# Patient Record
Sex: Female | Born: 1990 | Race: White | Hispanic: No | Marital: Single | Smoking: Never smoker
Health system: Southern US, Community
[De-identification: ages and names within clinical notes are randomized; demographics above are authoritative.]

## PROBLEM LIST (undated history)

## (undated) DIAGNOSIS — F32A Depression, unspecified: Secondary | ICD-10-CM

## (undated) DIAGNOSIS — F329 Major depressive disorder, single episode, unspecified: Secondary | ICD-10-CM

## (undated) DIAGNOSIS — G588 Other specified mononeuropathies: Secondary | ICD-10-CM

## (undated) DIAGNOSIS — F419 Anxiety disorder, unspecified: Secondary | ICD-10-CM

## (undated) HISTORY — DX: Depression, unspecified: F32.A

## (undated) HISTORY — DX: Major depressive disorder, single episode, unspecified: F32.9

## (undated) HISTORY — PX: NO PAST SURGERIES: SHX2092

## (undated) HISTORY — DX: Anxiety disorder, unspecified: F41.9

## (undated) HISTORY — DX: Other specified mononeuropathies: G58.8

---

## 2014-04-10 LAB — VARICELLA-ZOSTER BY PCR: Varicella Zoster Ab IgM: >165

## 2015-04-23 ENCOUNTER — Ambulatory Visit (INDEPENDENT_AMBULATORY_CARE_PROVIDER_SITE_OTHER): Payer: 59 | Admitting: Osteopathic Medicine

## 2015-04-23 ENCOUNTER — Encounter: Payer: Self-pay | Admitting: Osteopathic Medicine

## 2015-04-23 VITALS — BP 128/69 | HR 74 | Ht 60.0 in | Wt 141.0 lb

## 2015-04-23 DIAGNOSIS — Z113 Encounter for screening for infections with a predominantly sexual mode of transmission: Secondary | ICD-10-CM | POA: Diagnosis not present

## 2015-04-23 DIAGNOSIS — Z30014 Encounter for initial prescription of intrauterine contraceptive device: Secondary | ICD-10-CM | POA: Diagnosis not present

## 2015-04-23 DIAGNOSIS — M2550 Pain in unspecified joint: Secondary | ICD-10-CM | POA: Diagnosis not present

## 2015-04-23 DIAGNOSIS — Z3009 Encounter for other general counseling and advice on contraception: Secondary | ICD-10-CM

## 2015-04-23 LAB — POCT URINE PREGNANCY: PREG TEST UR: NEGATIVE

## 2015-04-23 NOTE — Patient Instructions (Signed)
You should hear about the results from your STD screening by the end of this week, please call us if you do not. We will try to get records from [facility] regarding your most recent Pap test results. If everything is normal, we can go ahead and order the [place] IUD. See below for more information on this IUD and let us know if you have any questions.    Levonorgestrel intrauterine device (IUD) What is this medicine? LEVONORGESTREL IUD (LEE voe nor jes trel) is a contraceptive (birth control) device. The device is placed inside the uterus by a healthcare professional. It is used to prevent pregnancy and can also be used to treat heavy bleeding that occurs during your period. Depending on the device, it can be used for 3 to 5 years. This medicine may be used for other purposes; ask your health care provider or pharmacist if you have questions. What should I tell my health care provider before I take this medicine? They need to know if you have any of these conditions: -abnormal Pap smear -cancer of the breast, uterus, or cervix -diabetes -endometritis -genital or pelvic infection now or in the past -have more than one sexual partner or your partner has more than one partner -heart disease -history of an ectopic or tubal pregnancy -immune system problems -IUD in place -liver disease or tumor -problems with blood clots or take blood-thinners -use intravenous drugs -uterus of unusual shape -vaginal bleeding that has not been explained -an unusual or allergic reaction to levonorgestrel, other hormones, silicone, or polyethylene, medicines, foods, dyes, or preservatives -pregnant or trying to get pregnant -breast-feeding How should I use this medicine? This device is placed inside the uterus by a health care professional. Talk to your pediatrician regarding the use of this medicine in children. Special care may be needed. Overdosage: If you think you have taken too much of this medicine  contact a poison control center or emergency room at once. NOTE: This medicine is only for you. Do not share this medicine with others. What if I miss a dose? This does not apply. What may interact with this medicine? Do not take this medicine with any of the following medications: -amprenavir -bosentan -fosamprenavir This medicine may also interact with the following medications: -aprepitant -barbiturate medicines for inducing sleep or treating seizures -bexarotene -griseofulvin -medicines to treat seizures like carbamazepine, ethotoin, felbamate, oxcarbazepine, phenytoin, topiramate -modafinil -pioglitazone -rifabutin -rifampin -rifapentine -some medicines to treat HIV infection like atazanavir, indinavir, lopinavir, nelfinavir, tipranavir, ritonavir -St. John's wort -warfarin This list may not describe all possible interactions. Give your health care provider a list of all the medicines, herbs, non-prescription drugs, or dietary supplements you use. Also tell them if you smoke, drink alcohol, or use illegal drugs. Some items may interact with your medicine. What should I watch for while using this medicine? Visit your doctor or health care professional for regular check ups. See your doctor if you or your partner has sexual contact with others, becomes HIV positive, or gets a sexual transmitted disease. This product does not protect you against HIV infection (AIDS) or other sexually transmitted diseases. You can check the placement of the IUD yourself by reaching up to the top of your vagina with clean fingers to feel the threads. Do not pull on the threads. It is a good habit to check placement after each menstrual period. Call your doctor right away if you feel more of the IUD than just the threads or if you cannot  feel the threads at all. The IUD may come out by itself. You may become pregnant if the device comes out. If you notice that the IUD has come out use a backup birth  control method like condoms and call your health care provider. Using tampons will not change the position of the IUD and are okay to use during your period. What side effects may I notice from receiving this medicine? Side effects that you should report to your doctor or health care professional as soon as possible: -allergic reactions like skin rash, itching or hives, swelling of the face, lips, or tongue -fever, flu-like symptoms -genital sores -high blood pressure -no menstrual period for 6 weeks during use -pain, swelling, warmth in the leg -pelvic pain or tenderness -severe or sudden headache -signs of pregnancy -stomach cramping -sudden shortness of breath -trouble with balance, talking, or walking -unusual vaginal bleeding, discharge -yellowing of the eyes or skin Side effects that usually do not require medical attention (report to your doctor or health care professional if they continue or are bothersome): -acne -breast pain -change in sex drive or performance -changes in weight -cramping, dizziness, or faintness while the device is being inserted -headache -irregular menstrual bleeding within first 3 to 6 months of use -nausea This list may not describe all possible side effects. Call your doctor for medical advice about side effects. You may report side effects to FDA at 1-800-FDA-1088. Where should I keep my medicine? This does not apply. NOTE: This sheet is a summary. It may not cover all possible information. If you have questions about this medicine, talk to your doctor, pharmacist, or health care provider.    2016, [employer]vier/[employer] Standard. (2011-04-09 13:54:04)

## 2015-04-23 NOTE — Progress Notes (Signed)
HPI: Tracey Yoder is a 25 y.o. female who presents to [facility] today for chief complaint of:  Chief Complaint  Patient presents with  . Establish Care    discuss contracception, discuss joiintpan if thhreis time.     CONTRACEPTION - Previous OCP caused irregular bleeding. She is thinking about IUD - after discussion of options she is thinking about Skyla.   JOINT PAIN - diffuse, worse after working out. Previously worked up for RA May of last year and was all negative. Takes Ibuprofen and this helps.     Past medical, social and family history reviewed: History reviewed. No pertinent past medical history. History reviewed. No pertinent past surgical history. Social History  Substance Use Topics  . Smoking status: Never Smoker   . Smokeless tobacco: Not on file  . Alcohol Use: Not on file   Family History  Problem Relation Age of Onset  . Cancer Mother     skin  . Depression Mother   . Cancer Father     skin  . Diabetes Father   . Depression Father   . Depression Sister   . Depression Brother   . Arthritis Maternal Aunt     rheumatoid  . Heart attack Maternal Grandmother   . Heart attack Maternal Aunt   . Anemia Sister     No current outpatient prescriptions on file.   No current facility-administered medications for this visit.   Allergies  Allergen Reactions  . Penicillins       Review of Systems: CONSTITUTIONAL:  No  fever, no chills, No  unintentional weight changes HEAD/EYES/EARS/NOSE/THROAT: No  headache, no vision change, no hearing change, No  sore throat, No  sinus pressure CARDIAC: No  chest pain, No  pressure, No palpitations, No  orthopnea RESPIRATORY: No  cough, No  shortness of breath/wheeze GASTROINTESTINAL: No  nausea, No  vomiting, No  abdominal pain, (+) blood in stool  histry possibl heorrhods, No  diarrhea, No  constipation  MUSCULOSKELETAL: (+) myalgia/arthralgia GENITOURINARY: No  incontinence,  No  abnormal genital bleeding/discharge SKIN: No  rash/wounds/concerning lesions HEM/ONC: No  easy bruising/bleeding, No  abnormal lymph node ENDOCRINE: No polyuria/polydipsia/polyphagia, No  heat/cold intolerance  NEUROLOGIC: No  weakness, No  dizziness, No  slurred speech PSYCHIATRIC: No  concerns with depression, No  concerns with anxiety, No sleep problems  Exam:  BP 128/69 mmHg  Pulse 74  Ht 5' (1.524 m)  Wt 141 lb (63.957 kg)  BMI 27.54 kg/m2 Constitutional: VS see above. General Appearance: alert, well-developed, well-nourished, NAD Eyes: Normal lids and conjunctive, non-icteric sclera, Ears, Nose, Mouth, Throat: MMM, Normal external inspection ears/nares/mouth/lips/gums,  Respiratory: Normal respiratory effort. no wheeze, no rhonchi, no rales Cardiovascular: S1/S2 normal, no murmur, no rub/gallop auscultated. RRR.  Musculoskeletal: Gait normal. No clubbing/cyanosis of digits.  Psychiatric: Normal judgment/insight. Normal mood and affect. Oriented x3.    No results found for this or any previous visit (from the past 72 hour(s)).    ASSESSMENT/PLAN:   General counseling and advice for contraceptive management  Encounter for initial prescription of intrauterine contraceptive device - Plan: POCT urine pregnancy  Routine screening for STI (sexually transmitted infection) - Plan: GC/chlamydia probe amp, urine, HIV antibody, RPR, Hepatitis panel, acute  Joint pain - Await records regarding previous workup, low suspicion for rheumatologic/immunologic process. We'll follow closely    Return for TBD for IUD insertion, pending lab results for STD screening. .  Total time spent 45 minutes, greater than 50%  of the visit was counseling and coordinating care for diagnosis of contraception.

## 2015-04-24 ENCOUNTER — Encounter: Payer: Self-pay | Admitting: Osteopathic Medicine

## 2015-04-24 DIAGNOSIS — M2550 Pain in unspecified joint: Secondary | ICD-10-CM | POA: Insufficient documentation

## 2015-04-24 DIAGNOSIS — Z3009 Encounter for other general counseling and advice on contraception: Secondary | ICD-10-CM | POA: Insufficient documentation

## 2015-04-24 LAB — HEPATITIS PANEL, ACUTE
HCV Ab: NEGATIVE
HEP B S AG: NEGATIVE
Hep A IgM: NONREACTIVE
Hep B C IgM: NONREACTIVE

## 2015-04-24 LAB — RPR

## 2015-04-24 LAB — GC/CHLAMYDIA PROBE AMP
CT Probe RNA: NOT DETECTED
GC Probe RNA: NOT DETECTED

## 2015-04-24 LAB — HIV ANTIBODY (ROUTINE TESTING W REFLEX): HIV: NONREACTIVE

## 2015-05-06 ENCOUNTER — Encounter: Payer: Self-pay | Admitting: Osteopathic Medicine

## 2015-05-06 ENCOUNTER — Ambulatory Visit (INDEPENDENT_AMBULATORY_CARE_PROVIDER_SITE_OTHER): Payer: 59 | Admitting: Osteopathic Medicine

## 2015-05-06 VITALS — BP 124/53 | HR 81 | Ht 60.0 in | Wt 138.0 lb

## 2015-05-06 DIAGNOSIS — Z30014 Encounter for initial prescription of intrauterine contraceptive device: Secondary | ICD-10-CM

## 2015-05-06 DIAGNOSIS — Z30430 Encounter for insertion of intrauterine contraceptive device: Secondary | ICD-10-CM

## 2015-05-06 LAB — POCT URINE PREGNANCY: PREG TEST UR: NEGATIVE

## 2015-05-06 NOTE — Patient Instructions (Signed)
IUD AFTER-CARE INSTRUCTIONS: READ THOROUGHLY  Your [place] IUD is currently approved to remain in place for 3 years. At that time, if you wish to receive a new IUD, this can be placed when your current one is removed. If you wish to remove your IUD at any time, for any reason, this can be done easily by your doctor.   You should feel for the strings to your IUD routinely. If you cannot locate the strings, it is recommended you alert your doctor of this.   Be aware that in the first few weeks, your new IUD may cause some discomfort/cramping as it settles into place in your uterus. To ease discomfort, you may apply heating pad to abdomen and take Ibuprofen  by mouth every 6 hours as needed, but avoid using this dose continuously for more than 5 - 7 days. Walking helps as well. You can also expect some irregular bleeding but it should not be significantly heavy.   If pain is severe or if severe bleeding occurs, or if foul-smelling discharge or fever develops, or if you have any other concerns - contact your doctor right away or go to the Emergency Room.  IUD's are a very reliable method of birth control, but no method is 100% effective. If you think you may be pregnant, see your doctor right away.   An IUD will not protect you from sexually transmitted infections such as HIV, gonorrhea, chlamydia, HPV and others.   It is recommended that you see your doctor as directed for routine well-woman care, which includes Pap testing and may include screening for infections.

## 2015-05-06 NOTE — Progress Notes (Signed)
HPI: Tracey Yoder is a 25 y.o. female who presents to [facility] today for chief complaint of:  Chief Complaint  Patient presents with  . Contraception     . Here today for IUD insertion procedure, see below.    Past medical, social and family history reviewed: No past medical history on file. No past surgical history on file. Social History  Substance Use Topics  . Smoking status: Never Smoker   . Smokeless tobacco: Not on file  . Alcohol Use: Not on file   Family History  Problem Relation Age of Onset  . Cancer Mother     skin  . Depression Mother   . Cancer Father     skin  . Diabetes Father   . Depression Father   . Depression Sister   . Depression Brother   . Arthritis Maternal Aunt     rheumatoid  . Heart attack Maternal Grandmother   . Heart attack Maternal Aunt   . Anemia Sister     No current outpatient prescriptions on file.   No current facility-administered medications for this visit.   Allergies  Allergen Reactions  . Penicillins Other (See Comments)      Review of Systems: CONSTITUTIONAL:  No  fever, no chills,  GENITOURINARY: LMP few weeks ago. No  abnormal genital bleeding/discharge  Exam:  BP 124/53 mmHg  Pulse 81  Ht 5' (1.524 m)  Wt 138 lb (62.596 kg)  BMI 26.95 kg/m2 See procedure note for GYN exam details  Results for orders placed or performed in visit on 05/06/15 (from the past 72 hour(s))  POCT urine pregnancy     Status: None   Collection Time: 05/06/15  2:05 PM  Result Value Ref Range   Preg Test, Ur Negative Negative    IUD PROCEDURE NOTE  PERTINENT RESULTS REVIEWED: PREGNANCY TEST PRIOR TO PROCEDURE: Negative GONORRHEA/CHLAMYDIA SCREEN: Negative  PRIOR TO PROCEDURE: INFORMED CONSENT OBTAINED: yes SEE SCANNED DOCUMENTS. Skyla insert reviewed personally with the patient.  ANY PRETREATMENT: Ibuprofen 800 mg prior to appt  PHYSICAL EXAM: GYN: No lesions/ulcers to external  genitalia, normal urethra, normal vaginal mucosa, physiologic discharge, cervix normal without lesions, uterus not enlarged or tender, adnexa no masses and nontender  DESCRIPTION OF PROCEDURE: Vaginal speculum placed. Cervix and proximal vagina cleaned with Hibiclens.Tenaculum applied at 12:00 cervical position and gentle traction applied. Uterus sounded to 7.5 cm. IUD placed. IUD threads cut to 2-3cm from cervical os. Tenaculum and speculum removed. Patient felt strings. Patient tolerated procedure. Sterile technique maintained.   IUD INFORMATION: BRAND: Skyla LOT NUMBER: [id number]  CARD GIVEN TO PATIENT: yes   ASSESSMENT/PLAN:  Encounter for IUD insertion  Encounter for initial prescription of intrauterine contraceptive device - Plan: POCT urine pregnancy   Return in about 4 weeks (around 06/03/2015), or sooner if needed, for IUD STRING CHECK.   Total time spent 25 minutes, greater than 50% of the visit was counseling and coordinating care for diagnosis of IUD insertion.

## 2015-05-22 ENCOUNTER — Encounter: Payer: Self-pay | Admitting: Osteopathic Medicine

## 2015-05-22 DIAGNOSIS — R748 Abnormal levels of other serum enzymes: Secondary | ICD-10-CM | POA: Insufficient documentation

## 2015-06-04 ENCOUNTER — Encounter: Payer: Self-pay | Admitting: Osteopathic Medicine

## 2015-06-04 ENCOUNTER — Ambulatory Visit (INDEPENDENT_AMBULATORY_CARE_PROVIDER_SITE_OTHER): Payer: 59 | Admitting: Osteopathic Medicine

## 2015-06-04 VITALS — BP 110/68 | HR 89 | Ht 60.0 in | Wt 138.0 lb

## 2015-06-04 DIAGNOSIS — Z30431 Encounter for routine checking of intrauterine contraceptive device: Secondary | ICD-10-CM

## 2015-06-04 DIAGNOSIS — Z975 Presence of (intrauterine) contraceptive device: Secondary | ICD-10-CM | POA: Insufficient documentation

## 2015-06-04 NOTE — Progress Notes (Signed)
HPI: Verlin DikeStephanie Wyrick is a 25 y.o. female who [name] for chief complaint of:  Chief Complaint  Patient presents with  . Follow-up    IUD CHECK     . HERE FOR IUD STRING CHECK, HAS HAD SOME SPOTTING, NO PELVIC PAIN OR SIGNIFICANT BLEEDING.    Past medical, social and family history reviewed: No past medical history on file. No past surgical history on file. Social History  Substance Use Topics  . Smoking status: Never Smoker   . Smokeless tobacco: Not on file  . Alcohol Use: Not on file   Family History  Problem Relation Age of Onset  . Cancer Mother     skin  . Depression Mother   . Cancer Father     skin  . Diabetes Father   . Depression Father   . Depression Sister   . Depression Brother   . Arthritis Maternal Aunt     rheumatoid  . Heart attack Maternal Grandmother   . Heart attack Maternal Aunt   . Anemia Sister     No current outpatient prescriptions on file.   No current facility-administered medications for this visit.   Allergies  Allergen Reactions  . Penicillins Other (See Comments)      Review of Systems: CONSTITUTIONAL:  No  fever, no chills GASTROINTESTINAL: No  nausea, No  vomiting, No  abdominal pain,  GENITOURINARY: No  abnormal genital bleeding/discharge other than some spotting SKIN: No  rash/wounds/concerning lesions  Exam:  BP 110/68 mmHg  Pulse 89  Ht 5' (1.524 m)  Wt 138 lb (62.596 kg)  BMI 26.95 kg/m2 Constitutional: VS see above. General Appearance: alert, well-developed, well-nourished, NAD GYN: No lesions/ulcers to external genitalia, normal urethra, normal vaginal mucosa, physiologic discharge, cervix normal without lesions, IUD strings visible.    ASSESSMENT/PLAN:  IUD check up  IUD (intrauterine device) in place     No Follow-up on file.

## 2015-10-28 ENCOUNTER — Ambulatory Visit (INDEPENDENT_AMBULATORY_CARE_PROVIDER_SITE_OTHER): Payer: 59 | Admitting: Osteopathic Medicine

## 2015-10-28 ENCOUNTER — Encounter: Payer: Self-pay | Admitting: Osteopathic Medicine

## 2015-10-28 VITALS — BP 119/74 | HR 84 | Ht 60.0 in | Wt 144.0 lb

## 2015-10-28 DIAGNOSIS — Z113 Encounter for screening for infections with a predominantly sexual mode of transmission: Secondary | ICD-10-CM

## 2015-10-28 DIAGNOSIS — R309 Painful micturition, unspecified: Secondary | ICD-10-CM | POA: Diagnosis not present

## 2015-10-28 LAB — POCT URINALYSIS DIPSTICK
BILIRUBIN UA: NEGATIVE
Blood, UA: NEGATIVE
Glucose, UA: NEGATIVE
KETONES UA: NEGATIVE
LEUKOCYTES UA: NEGATIVE
Nitrite, UA: NEGATIVE
PROTEIN UA: NEGATIVE
Spec Grav, UA: 1.02
Urobilinogen, UA: 0.2
pH, UA: 7

## 2015-10-28 MED ORDER — SULFAMETHOXAZOLE-TRIMETHOPRIM 800-160 MG PO TABS: 1.0000 | ORAL_TABLET | Freq: Two times a day (BID) | ORAL | 0 refills | Status: DC

## 2015-10-28 NOTE — Patient Instructions (Signed)

## 2015-10-28 NOTE — Progress Notes (Signed)
HPI: Tracey DikeStephanie Yoder is a 25 y.o. female  who [name], 10/28/15,  for chief complaint of:  Chief Complaint  Patient presents with  . painful urination     . Location/Context: pain in urethra with urination   Qualuty: burning, sharp pain with urination . Duration: 2 days . Modifying factors: Azo . Assoc signs/symptoms: no fever/chills, no nausea    Past medical, surgical, social and family history reviewed: No past medical history on file. No past surgical history on file. Social History  Substance Use Topics  . Smoking status: Never Smoker  . Smokeless tobacco: Not on file  . Alcohol use Not on file   Family History  Problem Relation Age of Onset  . Cancer Mother     skin  . Depression Mother   . Cancer Father     skin  . Diabetes Father   . Depression Father   . Depression Sister   . Depression Brother   . Arthritis Maternal Aunt     rheumatoid  . Heart attack Maternal Grandmother   . Heart attack Maternal Aunt   . Anemia Sister      Current medication list and allergy/intolerance information reviewed:   No current outpatient prescriptions on file.   No current facility-administered medications for this visit.    Allergies  Allergen Reactions  . Penicillins Other (See Comments)      Review of Systems:  Constitutional:  No  fever, no chills, No recent illness  Genitourinary: No  incontinence, No  abnormal genital bleeding, No abnormal genital discharge  Skin: No  Rash   Exam:  BP 119/74   Pulse 84   Ht 5' (1.524 m)   Wt 144 lb (65.3 kg)   BMI 28.12 kg/m   Constitutional: VS see above. General Appearance: alert, well-developed, well-nourished, NAD  Psychiatric: Normal judgment/insight. Normal mood and affect. Oriented x3.    Results for orders placed or performed in visit on 10/28/15 (from the past 72 hour(s))  POCT Urinalysis Dipstick     Status: None   Collection Time:  10/28/15  1:08 PM  Result Value Ref Range   Color, UA YELLOW    Clarity, UA CLEAR    Glucose, UA NEGATIVE    Bilirubin, UA NEGATIVE    Ketones, UA NEGATIVE    Spec Grav, UA 1.020    Blood, UA NEGATIVE    pH, UA 7.0    Protein, UA NEGATIVE    Urobilinogen, UA 0.2    Nitrite, UA NEGATIVE    Leukocytes, UA Negative Negative      ASSESSMENT/PLAN:   Symptoms classic for UTI, go ahead and start antibiotics and send urine for confirmatory culture. We'll also get gonorrhea/chlamydia testing in sexually active young adult, particularly given the UA in the office was negative.  Painful urination - Plan: POCT Urinalysis Dipstick, Urine Culture  Routine screening for STI (sexually transmitted infection) - Plan: GC/chlamydia probe amp, urine, CANCELED: GC/chlamydia probe amp, urine, CANCELED: GC/chlamydia probe amp, urine     Visit summary with medication list and pertinent instructions was printed for patient to review. All questions at time of visit were answered - patient instructed to contact office with any additional concerns. ER/RTC precautions were reviewed with the patient. Follow-up plan: Return if symptoms worsen or fail to improve.

## 2015-10-28 NOTE — Addendum Note (Signed)
Addended by: Pixie CasinoUNNINGHAM, Jeliyah Middlebrooks C on: 10/28/2015 01:58 PM   Modules accepted: Orders

## 2015-10-29 LAB — GC/CHLAMYDIA PROBE AMP
CT PROBE, AMP APTIMA: NOT DETECTED
GC Probe RNA: NOT DETECTED

## 2015-10-30 LAB — URINE CULTURE: Organism ID, Bacteria: NO GROWTH

## 2016-03-18 ENCOUNTER — Encounter: Payer: Self-pay | Admitting: Osteopathic Medicine

## 2016-03-18 ENCOUNTER — Ambulatory Visit (INDEPENDENT_AMBULATORY_CARE_PROVIDER_SITE_OTHER): Payer: 59 | Admitting: Osteopathic Medicine

## 2016-03-18 VITALS — BP 128/64 | HR 69 | Ht 60.0 in | Wt 151.0 lb

## 2016-03-18 DIAGNOSIS — R232 Flushing: Secondary | ICD-10-CM

## 2016-03-18 LAB — CBC WITH DIFFERENTIAL/PLATELET
Basophils Absolute: 0 cells/uL (ref 0–200)
Basophils Relative: 0 %
EOS PCT: 3 %
Eosinophils Absolute: 207 cells/uL (ref 15–500)
HCT: 41.8 % (ref 35.0–45.0)
HEMOGLOBIN: 13.9 g/dL (ref 11.7–15.5)
LYMPHS ABS: 1449 {cells}/uL (ref 850–3900)
Lymphocytes Relative: 21 %
MCH: 30.2 pg (ref 27.0–33.0)
MCHC: 33.3 g/dL (ref 32.0–36.0)
MCV: 90.7 fL (ref 80.0–100.0)
MPV: 9.8 fL (ref 7.5–12.5)
Monocytes Absolute: 621 cells/uL (ref 200–950)
Monocytes Relative: 9 %
NEUTROS ABS: 4623 {cells}/uL (ref 1500–7800)
Neutrophils Relative %: 67 %
Platelets: 328 10*3/uL (ref 140–400)
RBC: 4.61 MIL/uL (ref 3.80–5.10)
RDW: 12.9 % (ref 11.0–15.0)
WBC: 6.9 10*3/uL (ref 3.8–10.8)

## 2016-03-18 NOTE — Patient Instructions (Signed)
Your homework: take a photo of the rash and keep a record/calendar of your symptoms. Next steps may involve more lab work, depending on if your symptoms persist, what lab results are, what rash looks like, etc.

## 2016-03-18 NOTE — Progress Notes (Signed)
HPI: Tracey Yoder is a 25 y.o. female  who presents to [place] today, 03/18/16,  for chief complaint of:  Chief Complaint  Patient presents with  . Other    THYROID CONCERNS     . Location/Quality: hot flashes and rash on legs - Coworker told her this may be a thyroid issue and she should come get checked out. She states she has measured her temperature during these hot flashes and it is normal . Duration: few months . Timing: 2 - 3 times per week, goes away on its own after an hour  . Modifying factors: Nothing seems to make it better, nothing seems to make it worse, nothing seems to trigger . Assoc signs/symptoms:  o no dizziness, no LOC, no lightheaded.  o Rash seems to correlate with the hot flashes, seems to precede these. Itches, looks like poison Ivy, on the front of both legs, lasts about a day - lotion doesn't help, thought might be eczema or dry skin      Past medical, surgical, social and family history reviewed: Patient Active Problem List   Diagnosis Date Noted  . IUD (intrauterine device) in place 06/04/2015  . Abnormal liver enzymes 05/22/2015  . Joint pain 04/24/2015  . General counseling and advice for contraceptive management 04/24/2015   No past surgical history on file. Social History  Substance Use Topics  . Smoking status: Never Smoker  . Smokeless tobacco: Not on file  . Alcohol use Not on file   Family History  Problem Relation Age of Onset  . Cancer Mother     skin  . Depression Mother   . Cancer Father     skin  . Diabetes Father   . Depression Father   . Depression Sister   . Depression Brother   . Arthritis Maternal Aunt     rheumatoid  . Heart attack Maternal Grandmother   . Heart attack Maternal Aunt   . Anemia Sister      Current medication list and allergy/intolerance information reviewed:   No current outpatient prescriptions on file prior to visit.   No current  facility-administered medications on file prior to visit.    Allergies  Allergen Reactions  . Penicillins Other (See Comments)      Review of Systems:  Constitutional: No recent illness, modest weight gain over the past year  HEENT: No  headache, no vision change  Cardiac: No  chest pain, No  pressure, No palpitations  Respiratory:  No  shortness of breath. No  Cough  Gastrointestinal: No  abdominal pain, no change on bowel habits  Musculoskeletal: No new myalgia/arthralgia, no joint pain/swelling  Genitourinary: Does not have periods on IUD, no history of irregular periods  Skin: +Rash as per history of present illness the patient states skin is normal at this point, no other hair or skin changes  Endocrine: No polyuria/polydipsia, no heat or cold intolerance  Hem/Onc: No  easy bruising/bleeding, No  abnormal lumps/bumps  Neurologic: No  weakness, No  Dizziness, no headache/migraine, no loss of consciousness  Psychiatric: No  concerns with depression, No  concerns with anxiety  Exam:  BP 128/64   Pulse 69   Ht 5' (1.524 m)   Wt 151 lb (68.5 kg)   BMI 29.49 kg/m   Constitutional: VS see above. General Appearance: alert, well-developed, well-nourished, NAD  Eyes: Normal lids and conjunctive, non-icteric sclera  Ears, Nose, Mouth, Throat: MMM, Normal external inspection ears/nares/mouth/lips/gums. No appreciable thyromegaly/mass  Neck: No masses, trachea midline.   Respiratory: Normal respiratory effort. no wheeze, no rhonchi, no rales  Cardiovascular: S1/S2 normal, no murmur, no rub/gallop auscultated. RRR.   Musculoskeletal: Gait normal. Symmetric and independent movement of all extremities  Neurological: Normal balance/coordination. No tremor.  Skin: warm, dry, intact.   Psychiatric: Normal judgment/insight. Normal mood and affect. Oriented x3.    Results for orders placed or performed in visit on 03/18/16 (from the past 72 hour(s))  CBC with  Differential/Platelet     Status: None   Collection Time: 03/18/16  3:23 PM  Result Value Ref Range   WBC 6.9 3.8 - 10.8 K/uL   RBC 4.61 3.80 - 5.10 MIL/uL   Hemoglobin 13.9 11.7 - 15.5 g/dL   HCT 41.8 35.0 - 45.0 %   MCV 90.7 80.0 - 100.0 fL   MCH 30.2 27.0 - 33.0 pg   MCHC 33.3 32.0 - 36.0 g/dL   RDW 12.9 11.0 - 15.0 %   Platelets 328 140 - 400 K/uL   MPV 9.8 7.5 - 12.5 fL   Neutro Abs 4,623 1,500 - 7,800 cells/uL   Lymphs Abs 1,449 850 - 3,900 cells/uL   Monocytes Absolute 621 200 - 950 cells/uL   Eosinophils Absolute 207 15 - 500 cells/uL   Basophils Absolute 0 0 - 200 cells/uL   Neutrophils Relative % 67 %   Lymphocytes Relative 21 %   Monocytes Relative 9 %   Eosinophils Relative 3 %   Basophils Relative 0 %   Smear Review Criteria for review not met   COMPLETE METABOLIC PANEL WITH GFR     Status: None   Collection Time: 03/18/16  3:23 PM  Result Value Ref Range   Sodium 139 135 - 146 mmol/L   Potassium 4.6 3.5 - 5.3 mmol/L   Chloride 104 98 - 110 mmol/L   CO2 28 20 - 31 mmol/L   Glucose, Bld 80 65 - 99 mg/dL   BUN 14 7 - 25 mg/dL   Creat 0.68 0.50 - 1.10 mg/dL   Total Bilirubin 0.7 0.2 - 1.2 mg/dL   Alkaline Phosphatase 43 33 - 115 U/L   AST 18 10 - 30 U/L   ALT 17 6 - 29 U/L   Total Protein 7.4 6.1 - 8.1 g/dL   Albumin 4.4 3.6 - 5.1 g/dL   Calcium 9.4 8.6 - 10.2 mg/dL   GFR, Est African American >89 >=60 mL/min   GFR, Est Non African American >89 >=60 mL/min  Hemoglobin A1c     Status: None   Collection Time: 03/18/16  3:23 PM  Result Value Ref Range   Hgb A1c MFr Bld 4.8 <5.7 %    Comment:   For the purpose of screening for the presence of diabetes:   <5.7%       Consistent with the absence of diabetes 5.7-6.4 %   Consistent with increased risk for diabetes (prediabetes) >=6.5 %     Consistent with diabetes   This assay result is consistent with a decreased risk of diabetes.   Currently, no consensus exists regarding use of hemoglobin A1c  for diagnosis of diabetes in children.   According to American Diabetes Association (ADA) guidelines, hemoglobin A1c <7.0% represents optimal control in non-pregnant diabetic patients. Different metrics may apply to specific patient populations. Standards of Medical Care in Diabetes (ADA).      Mean Plasma Glucose 91 mg/dL  TSH     Status: None   Collection Time: 03/18/16  3:23 PM  Result Value Ref Range   TSH 4.05 mIU/L    Comment:   Reference Range   > or = 20 Years  0.40-4.50   Pregnancy Range First trimester  0.26-2.66 Second trimester 0.55-2.73 Third trimester  0.43-2.91     VITAMIN D 25 Hydroxy (Vit-D Deficiency, Fractures)     Status: Abnormal   Collection Time: 03/18/16  3:23 PM  Result Value Ref Range   Vit D, 25-Hydroxy 27 (L) 30 - 100 ng/mL    Comment: Vitamin D Status           25-OH Vitamin D        Deficiency                <20 ng/mL        Insufficiency         20 - 29 ng/mL        Optimal             > or = 30 ng/mL   For 25-OH Vitamin D testing on patients on D2-supplementation and patients for whom quantitation of D2 and D3 fractions is required, the QuestAssureD 25-OH VIT D, (D2,D3), LC/MS/MS is recommended: order code [id number] (patients > 2 yrs).      ASSESSMENT/PLAN: Advised to keep diary of symptoms, including temperature readings. Would be helpful to have rash photograph for better characterization. Likely will require further workup, routine labs negative except for slight vitamin D deficiency which I don't think explains her symptoms. Not quite fitting the clinical picture for more concerning endocrine problem (carcinoid, pheo) or neuro problem (autonomic disorder), ?rheumatologic problem. Will help to see rash.   Hot flashes - Plan: CBC with Differential/Platelet, COMPLETE METABOLIC PANEL WITH GFR, Hemoglobin A1c, TSH, VITAMIN D 25 Hydroxy (Vit-D Deficiency, Fractures)    Patient Instructions  Your homework: take a photo of the rash and keep a  record/calendar of your symptoms. Next steps may involve more lab work, depending on if your symptoms persist, what lab results are, what rash looks like, etc.     Visit summary with medication list and pertinent instructions was printed for patient to review. All questions at time of visit were answered - patient instructed to contact office with any additional concerns. ER/RTC precautions were reviewed with the patient. Follow-up plan: Return for follow-up based on lab results.

## 2016-03-19 LAB — COMPLETE METABOLIC PANEL WITH GFR
ALBUMIN: 4.4 g/dL (ref 3.6–5.1)
ALK PHOS: 43 U/L (ref 33–115)
ALT: 17 U/L (ref 6–29)
AST: 18 U/L (ref 10–30)
BUN: 14 mg/dL (ref 7–25)
CO2: 28 mmol/L (ref 20–31)
Calcium: 9.4 mg/dL (ref 8.6–10.2)
Chloride: 104 mmol/L (ref 98–110)
Creat: 0.68 mg/dL (ref 0.50–1.10)
GFR, Est African American: >89 mL/min (ref >=60)
GFR, Est Non African American: >89 mL/min (ref >=60)
GLUCOSE: 80 mg/dL (ref 65–99)
Potassium: 4.6 mmol/L (ref 3.5–5.3)
SODIUM: 139 mmol/L (ref 135–146)
TOTAL PROTEIN: 7.4 g/dL (ref 6.1–8.1)
Total Bilirubin: 0.7 mg/dL (ref 0.2–1.2)

## 2016-03-19 LAB — HEMOGLOBIN A1C
Hgb A1c MFr Bld: 4.8 % (ref <5.7)
Mean Plasma Glucose: 91 mg/dL

## 2016-03-19 LAB — VITAMIN D 25 HYDROXY (VIT D DEFICIENCY, FRACTURES): VIT D 25 HYDROXY: 27 ng/mL — AB (ref 30–100)

## 2016-03-19 LAB — TSH: TSH: 4.05 mIU/L

## 2016-03-20 ENCOUNTER — Encounter: Payer: Self-pay | Admitting: Osteopathic Medicine

## 2016-03-20 DIAGNOSIS — R232 Flushing: Secondary | ICD-10-CM

## 2016-03-20 DIAGNOSIS — R21 Rash and other nonspecific skin eruption: Secondary | ICD-10-CM

## 2016-04-10 ENCOUNTER — Ambulatory Visit (INDEPENDENT_AMBULATORY_CARE_PROVIDER_SITE_OTHER): Payer: 59 | Admitting: Osteopathic Medicine

## 2016-04-10 ENCOUNTER — Encounter: Payer: Self-pay | Admitting: Osteopathic Medicine

## 2016-04-10 VITALS — BP 116/56 | HR 83 | Ht 60.0 in | Wt 150.0 lb

## 2016-04-10 DIAGNOSIS — R21 Rash and other nonspecific skin eruption: Secondary | ICD-10-CM | POA: Diagnosis not present

## 2016-04-10 DIAGNOSIS — R232 Flushing: Secondary | ICD-10-CM

## 2016-04-10 LAB — CK: Total CK: 79 U/L (ref 7–177)

## 2016-04-10 NOTE — Progress Notes (Addendum)
HPI: Tracey Yoder is a 26 y.o. female  who [name],  for chief complaint of:  Chief Complaint  Patient presents with  . Follow-up    Rash still present    Still having intermittent hot flashes throughout the week, rash on lower legs is present today though is less severe than in pictures that she recently showed. Anterior legs, dry and mild scaling, itchy. Initial lab work was normal.   Past medical, surgical, social and family history reviewed: Patient Active Problem List   Diagnosis Date Noted  . IUD (intrauterine device) in place 06/04/2015  . Abnormal liver enzymes 05/22/2015  . Joint pain 04/24/2015  . General counseling and advice for contraceptive management 04/24/2015   No past surgical history on file. Social History  Substance Use Topics  . Smoking status: Never Smoker  . Smokeless tobacco: Never Used  . Alcohol use Not on file   Family History  Problem Relation Age of Onset  . Cancer Mother     skin  . Depression Mother   . Cancer Father     skin  . Diabetes Father   . Depression Father   . Depression Sister   . Depression Brother   . Arthritis Maternal Aunt     rheumatoid  . Heart attack Maternal Grandmother   . Heart attack Maternal Aunt   . Anemia Sister      Current medication list and allergy/intolerance information reviewed:   No current outpatient prescriptions on file prior to visit.   No current facility-administered medications on file prior to visit.    Allergies  Allergen Reactions  . Penicillins Other (See Comments)      Review of Systems:  Constitutional: No recent illness, intermittent hot flashes as per history of present illness  HEENT: No  headache, no vision change  Cardiac: No  chest pain, No  pressure, No palpitations  Respiratory:  No  shortness of breath. No  Cough  Gastrointestinal: No  abdominal pain, no change on bowel habits, occasional left  lower pelvic discomfort  Musculoskeletal: No new myalgia/arthralgia  Skin: +Rash  Hem/Onc: No  easy bruising/bleeding  Neurologic: No  weakness, No  Dizziness   Exam:  BP (!) 116/56   Pulse 83   Ht 5' (1.524 m)   Wt 150 lb (68 kg)   BMI 29.29 kg/m   Constitutional: VS see above. General Appearance: alert, well-developed, well-nourished, NAD  Eyes: Normal lids and conjunctive, non-icteric sclera  Ears, Nose, Mouth, Throat: MMM, Normal external inspection ears/nares/mouth/lips/gums.  Neck: No masses, trachea midline.   Respiratory: Normal respiratory effort. no wheeze, no rhonchi, no rales  Cardiovascular: S1/S2 normal, no murmur, no rub/gallop auscultated. RRR.   Musculoskeletal: Gait normal. Symmetric and independent movement of all extremities  Neurological: Normal balance/coordination. No tremor.  Skin: warm, dry, intact. Blanching, mild dry/scaly rash anterior shins. Consistent with dermatitis but questionable psoriatic character  Psychiatric: Normal judgment/insight. Normal mood and affect. Oriented x3.     PRE-OP DIAGNOSIS: Abnormal Skin Rash POST-OP DIAGNOSIS: Same  PROCEDURE: skin biopsy Performing Physician: Sunnie NielsenNatalie Amerah Puleo   PROCEDURE:  Punch (Size 4mm)   The area surrounding the skin lesion was prepared and draped in the usual sterile manner. The lesion was removed in the usual manner by the biopsy method noted above. Hemostasis was assured. The patient tolerated the procedure well.   Closure:      suture x1 simple interrupted   Followup: The patient tolerated  the procedure well without complications.  Standard post-procedure care is explained and return precautions are given.    ASSESSMENT/PLAN: Patient has not kept detailed temperature diary, not sure what to make of hot flashes and accommodation with rash, hopefully biopsy and inflammatory markers as well as hormone levels will give [place] some hints. Patient does not have periods, currently on IUD  birth control. Consider pelvic exam/ultrasound but patient declines at this time.  Rash and nonspecific skin eruption - Plan: Dermatology pathology, ANA, High sensitivity CRP, CK, Rheumatoid factor, Sedimentation rate, FSH/LH, ANA  Hot flashes - Plan: ANA, High sensitivity CRP, CK, Rheumatoid factor, Sedimentation rate, FSH/LH, ANA      Visit summary with medication list and pertinent instructions was printed for patient to review. All questions at time of visit were answered - patient instructed to contact office with any additional concerns. ER/RTC precautions were reviewed with the patient. Follow-up plan: Return for follow-up depending on lab/pathology results, 10-14 days for suture removal.    Addendum: Spoke to patient w/ pathology results, nonsepcific spongiotic dermatitis. Not sure how this relates to hot flashes or if even related at all. She states rash is still bothersome and itching, would like something for it, mid-potency steroids sent. I let her know I've gone over her case w/ a few colleagues and I think we should have her keep a diary of symptoms (she has not been keeping up with this though states her random temps have been normal), temperature, foods, rash, anything else she feels pertinent - pt will call to schedule follow up with [name] to get a second opinion.

## 2016-04-11 LAB — SEDIMENTATION RATE: Sed Rate: 4 mm/hr (ref 0–20)

## 2016-04-11 LAB — FSH/LH
FSH: 4.2 m[IU]/mL
LH: 8.5 m[IU]/mL

## 2016-04-11 LAB — HIGH SENSITIVITY CRP: CRP, High Sensitivity: 0.4 mg/L

## 2016-04-13 LAB — ANA: ANA: NEGATIVE

## 2016-04-13 LAB — RHEUMATOID FACTOR: Rhuematoid fact SerPl-aCnc: <14 IU/mL (ref <14)

## 2016-04-22 MED ORDER — BETAMETHASONE DIPROPIONATE 0.05 % EX CREA: TOPICAL_CREAM | Freq: Two times a day (BID) | CUTANEOUS | 1 refills | Status: DC

## 2016-04-22 NOTE — Addendum Note (Signed)
Addended by: Deirdre PippinsALEXANDER, Bao Coreas M on: 04/22/2016 05:21 PM   Modules accepted: Orders

## 2016-07-21 ENCOUNTER — Ambulatory Visit (INDEPENDENT_AMBULATORY_CARE_PROVIDER_SITE_OTHER): Payer: 59 | Admitting: Osteopathic Medicine

## 2016-07-21 ENCOUNTER — Encounter: Payer: Self-pay | Admitting: Osteopathic Medicine

## 2016-07-21 VITALS — BP 116/63 | HR 75 | Ht 60.0 in | Wt 144.0 lb

## 2016-07-21 DIAGNOSIS — Z8719 Personal history of other diseases of the digestive system: Secondary | ICD-10-CM | POA: Insufficient documentation

## 2016-07-21 DIAGNOSIS — L723 Sebaceous cyst: Secondary | ICD-10-CM

## 2016-07-21 NOTE — Progress Notes (Signed)
HPI: Tracey Yoder is a 26 y.o. female  who presents to [facility] today, 07/21/16,  for chief complaint of:  Chief Complaint  Patient presents with  . Other    cyst in head    Concerned about "bumps on head" on scalp. Nondraining. No itching. No rash. One is particularly bothersome. Has been present for months.    Past medical history, surgical history, social history and family history reviewed.  Patient Active Problem List   Diagnosis Date Noted  . History of gluten intolerance 07/21/2016  . IUD (intrauterine device) in place 06/04/2015  . Abnormal liver enzymes 05/22/2015  . Joint pain 04/24/2015  . General counseling and advice for contraceptive management 04/24/2015    Current medication list and allergy/intolerance information reviewed.   No current outpatient prescriptions on file prior to visit.   No current facility-administered medications on file prior to visit.    Allergies  Allergen Reactions  . Penicillins Other (See Comments)      Review of Systems:  HEENT: No  headache, no vision change  Skin: No  Rash  Hem/Onc: +abnormal lumps/bumps   Exam:  BP 116/63   Pulse 75   Ht 5' (1.524 m)   Wt 144 lb (65.3 kg)   BMI 28.12 kg/m   Constitutional: VS see above. General Appearance: alert, well-developed, well-nourished, NAD.  Skin: warm, dry, intact. (+)1-1.5 cm diameter sebaceous cysts scalp x2  Psychiatric: Normal judgment/insight. Normal mood and affect. Oriented x3.    No results found for this or any previous visit (from the past 2160 hour(s)).  No results found.  No flowsheet data found.  Depression screen PHQ 2/9 04/23/2015  Decreased Interest 0  Down, Depressed, Hopeless 0  PHQ - 2 Score 0      ASSESSMENT/PLAN:   Will set her up with [name] for removal   Sebaceous cyst - Plan: CANCELED: Ambulatory referral to Dermatology  History of gluten intolerance      Follow-up plan: Return for  skin procedure with Dr T .  Visit summary with medication list and pertinent instructions was printed for patient to review, alert [place] if any changes needed. All questions at time of visit were answered - patient instructed to contact office with any additional concerns. ER/RTC precautions were reviewed with the patient and understanding verbalized.   Note: Total time spent 10 minutes, greater than 50% of the visit was spent face-to-face counseling and coordinating care for the following: The primary encounter diagnosis was Sebaceous cyst. A diagnosis of History of gluten intolerance was also pertinent to this visit.Marland Kitchen

## 2016-07-22 ENCOUNTER — Ambulatory Visit: Payer: 59 | Admitting: Osteopathic Medicine

## 2016-08-04 ENCOUNTER — Ambulatory Visit (INDEPENDENT_AMBULATORY_CARE_PROVIDER_SITE_OTHER): Payer: 59 | Admitting: Sports Medicine

## 2016-08-04 ENCOUNTER — Encounter: Payer: Self-pay | Admitting: Sports Medicine

## 2016-08-04 DIAGNOSIS — L723 Sebaceous cyst: Secondary | ICD-10-CM

## 2016-08-04 DIAGNOSIS — L7211 Pilar cyst: Secondary | ICD-10-CM | POA: Insufficient documentation

## 2016-08-04 MED ORDER — HYDROCODONE-ACETAMINOPHEN 5-325 MG PO TABS: 1.0000 | ORAL_TABLET | Freq: Three times a day (TID) | ORAL | 0 refills | Status: DC | PRN

## 2016-08-04 NOTE — Progress Notes (Signed)
   Subjective:    I'm seeing this patient as a consultation for:  Dr. Sunnie NielsenNatalie Yoder  CC: Mass on scalp  HPI: For years this pleasant 26 year old female has had a couple of masses on her scalp, 1 on the top is painful, the other one is bothersome. She has a third that doesn't bother her. Symptoms are moderate, persistent, she desires excision.  Past medical history:  Negative.  See flowsheet/record as well for more information.  Surgical history: Negative.  See flowsheet/record as well for more information.  Family history: Negative.  See flowsheet/record as well for more information.  Social history: Negative.  See flowsheet/record as well for more information.  Allergies, and medications have been entered into the medical record, reviewed, and no changes needed.   Review of Systems: No headache, visual changes, nausea, vomiting, diarrhea, constipation, dizziness, abdominal pain, skin rash, fevers, chills, night sweats, weight loss, swollen lymph nodes, body aches, joint swelling, muscle aches, chest pain, shortness of breath, mood changes, visual or auditory hallucinations.   Objective:   General: Well Developed, well nourished, and in no acute distress.  Neuro/Psych: Alert and oriented x3, extra-ocular muscles intact, able to move all 4 extremities, sensation grossly intact. Skin: Warm and dry, no rashes noted.  Respiratory: Not using accessory muscles, speaking in full sentences, trachea midline.  Cardiovascular: Pulses palpable, no extremity edema. Abdomen: Does not appear distended. Scalp: There are 3 masses palpable, well-defined, the 2 masses on the right side are well-defined, movable, and reasonable sebaceous cyst.  Procedure:  Excision of #2, 1.5 cm well-defined scalp masses Risks, benefits, and alternatives explained and consent obtained. Time out conducted. A small amount of hair was shaved overlying the scalp masses Surface prepped with alcohol. 5cc bupivacaine with  epinephine infiltrated in a field block around the 2 masses. Adequate anesthesia ensured. Area prepped and draped in a sterile fashion. Excision performed with: I made a linear incision over both of the masses, using both sharp and blunt dissection as well as pressure I was able to remove these masses in block, and intact, they were sebaceous cyst. I then placed a single 3-0 horizontal mattress Ethilon suture in each incision. Hemostasis achieved. Pt stable.  Impression and Recommendations:   This case required medical decision making of moderate complexity.  Sebaceous cyst 2 sebaceous cysts removed from the scalp, return in 7 days for suture removal. Hydrocodone for postprocedural pain.

## 2016-08-04 NOTE — Assessment & Plan Note (Signed)
2 sebaceous cysts removed from the scalp, return in 7 days for suture removal. Hydrocodone for postprocedural pain.

## 2016-08-10 ENCOUNTER — Ambulatory Visit (INDEPENDENT_AMBULATORY_CARE_PROVIDER_SITE_OTHER): Payer: 59 | Admitting: Sports Medicine

## 2016-08-10 DIAGNOSIS — L723 Sebaceous cyst: Secondary | ICD-10-CM

## 2016-08-10 NOTE — Assessment & Plan Note (Signed)
Doing well 6 days post sebaceous cyst excision, sutures removed, return as needed, I'm happy to remove further cysts should they become symptomatic.

## 2016-08-10 NOTE — Progress Notes (Signed)
  Subjective: This is a pleasant 26 year old female 6 days post excision of 2 sebaceous cysts from her scalp, doing well.   Objective: General: Well-developed, well-nourished, and in no acute distress. Scalp: Incisions are clean, dry, intact, I removed both sutures.  Assessment/plan:   Sebaceous cyst Doing well 6 days post sebaceous cyst excision, sutures removed, return as needed, I'm happy to remove further cysts should they become symptomatic.

## 2016-09-15 ENCOUNTER — Ambulatory Visit (INDEPENDENT_AMBULATORY_CARE_PROVIDER_SITE_OTHER): Payer: 59 | Admitting: Obstetrics & Gynecology

## 2016-09-15 ENCOUNTER — Encounter: Payer: Self-pay | Admitting: Obstetrics & Gynecology

## 2016-09-15 VITALS — BP 135/77 | HR 93 | Ht 60.0 in | Wt 139.0 lb

## 2016-09-15 DIAGNOSIS — Z01419 Encounter for gynecological examination (general) (routine) without abnormal findings: Secondary | ICD-10-CM

## 2016-09-15 DIAGNOSIS — Z124 Encounter for screening for malignant neoplasm of cervix: Secondary | ICD-10-CM

## 2016-09-15 DIAGNOSIS — N6320 Unspecified lump in the left breast, unspecified quadrant: Secondary | ICD-10-CM

## 2016-09-15 DIAGNOSIS — Z23 Encounter for immunization: Secondary | ICD-10-CM | POA: Diagnosis not present

## 2016-09-15 DIAGNOSIS — Z113 Encounter for screening for infections with a predominantly sexual mode of transmission: Secondary | ICD-10-CM | POA: Diagnosis not present

## 2016-09-15 DIAGNOSIS — R102 Pelvic and perineal pain: Secondary | ICD-10-CM

## 2016-09-15 NOTE — Progress Notes (Signed)
Subjective:    Tracey Yoder is a 26 y.o. SW G0  female who presents for an annual exam. She has had a Skyla since 2/17 and has been having pain for the last 2 months. She also started with new onset monthly periods and spotting after sex. The patient is sexually active. GYN screening history: last pap: was normal. The patient wears seatbelts: yes. The patient participates in regular exercise: yes. Has the patient ever been transfused or tattooed?: no. The patient reports that there is not domestic violence in her life.   Menstrual History: OB History    Gravida Para Term Preterm AB Living   0 0 0 0 0 0   SAB TAB Ectopic Multiple Live Births   0 0 0 0 0      Menarche age: 73 Patient's last menstrual period was 09/14/2016.    The following portions of the patient's history were reviewed and updated as appropriate: allergies, current medications, past family history, past medical history, past social history, past surgical history and problem list.  Review of Systems Pertinent items are noted in HPI.   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, works night shift, drinks lots of caffeine Monogamous for 6 months Doesn't use condoms FH- no breast or colon cancer, m aunt with cervical cancer Hasn't had Gardasil   Objective:    BP 135/77   Pulse 93   Ht 5' (1.524 m)   Wt 139 lb (63 kg)   LMP 09/14/2016   BMI 27.15 kg/m   General Appearance:    Alert, cooperative, no distress, appears stated age  Head:    Normocephalic, without obvious abnormality, atraumatic  Eyes:    PERRL, conjunctiva/corneas clear, EOM's intact, fundi    benign, both eyes  Ears:    Normal TM's and external ear canals, both ears  Nose:   Nares normal, septum midline, mucosa normal, no drainage    or sinus tenderness  Throat:   Lips, mucosa, and tongue normal; teeth and gums normal  Neck:   Supple, symmetrical, trachea midline, no adenopathy;    thyroid:  no enlargement/tenderness/nodules; no carotid  bruit or JVD  Back:     Symmetric, no curvature, ROM normal, no CVA tenderness  Lungs:     Clear to auscultation bilaterally, respirations unlabored  Chest Wall:    No tenderness or deformity   Heart:    Regular rate and rhythm, S1 and S2 normal, no murmur, rub   or gallop  Breast Exam:    Significant difference in the amount of "fibrocystic" changes, lots more in left breast, several discrete masses  Abdomen:     Soft, non-tender, bowel sounds active all four quadrants,    no masses, no organomegaly  Genitalia:    Normal female without lesion, discharge or tenderness, IUD string seen, NSSR, NT, no masses or tenderness     Extremities:   Extremities normal, atraumatic, no cyanosis or edema  Pulses:   2+ and symmetric all extremities  Skin:   Skin color, texture, turgor normal, no rashes or lesions  Lymph nodes:   Cervical, supraclavicular, and axillary nodes normal  Neurologic:   CNII-XII intact, normal strength, sensation and reflexes    throughout  .    Assessment:    Healthy female exam.   Breast masses Pelvic pain Post coital bleeding   Plan:     Thin prep Pap smear.   Start Gardasil today Diagnostic mammogram STI testing Gyn u/s RTC in 2  months for Gardasil #2

## 2016-09-16 LAB — RPR

## 2016-09-16 LAB — TSH: TSH: 2.83 m[IU]/L

## 2016-09-16 LAB — HEPATITIS C ANTIBODY: HCV Ab: NEGATIVE

## 2016-09-16 LAB — HEPATITIS B SURFACE ANTIGEN: HEP B S AG: NEGATIVE

## 2016-09-16 LAB — HIV ANTIBODY (ROUTINE TESTING W REFLEX): HIV 1&2 Ab, 4th Generation: NONREACTIVE

## 2016-09-18 LAB — CYTOLOGY - PAP
Chlamydia: NEGATIVE
NEISSERIA GONORRHEA: NEGATIVE

## 2016-09-21 ENCOUNTER — Ambulatory Visit (INDEPENDENT_AMBULATORY_CARE_PROVIDER_SITE_OTHER): Payer: 59

## 2016-09-21 DIAGNOSIS — N939 Abnormal uterine and vaginal bleeding, unspecified: Secondary | ICD-10-CM | POA: Diagnosis not present

## 2016-09-21 DIAGNOSIS — R102 Pelvic and perineal pain: Secondary | ICD-10-CM

## 2016-09-21 DIAGNOSIS — N941 Unspecified dyspareunia: Secondary | ICD-10-CM

## 2016-09-21 DIAGNOSIS — Z975 Presence of (intrauterine) contraceptive device: Secondary | ICD-10-CM

## 2016-09-22 ENCOUNTER — Ambulatory Visit
Admission: RE | Admit: 2016-09-22 | Discharge: 2016-09-22 | Disposition: A | Discharge status: Home / Self Care | Payer: 59 | Source: Ambulatory Visit | Attending: Obstetrics & Gynecology | Admitting: Obstetrics & Gynecology

## 2016-09-22 DIAGNOSIS — N6320 Unspecified lump in the left breast, unspecified quadrant: Secondary | ICD-10-CM

## 2016-09-24 ENCOUNTER — Telehealth: Payer: Self-pay | Admitting: *Deleted

## 2016-09-24 NOTE — Telephone Encounter (Signed)
Pt notified of abnomral pap LGSIL and is scheduled for Colposcopy with Dr Marice Potterove.

## 2016-09-24 NOTE — Telephone Encounter (Signed)
-----   Message from Allie BossierMyra C Dove, MD sent at 09/22/2016 10:30 AM EDT ----- Her pap showed LGSIL. She will need a colpo Please encourage Gardasil. I already encouraged this.

## 2016-10-06 ENCOUNTER — Ambulatory Visit (INDEPENDENT_AMBULATORY_CARE_PROVIDER_SITE_OTHER): Payer: 59 | Admitting: Obstetrics & Gynecology

## 2016-10-06 ENCOUNTER — Encounter: Payer: Self-pay | Admitting: Obstetrics & Gynecology

## 2016-10-06 ENCOUNTER — Encounter: Payer: Self-pay | Admitting: *Deleted

## 2016-10-06 VITALS — BP 123/77 | HR 89 | Resp 16 | Ht 60.0 in | Wt 136.0 lb

## 2016-10-06 DIAGNOSIS — R87612 Low grade squamous intraepithelial lesion on cytologic smear of cervix (LGSIL): Secondary | ICD-10-CM

## 2016-10-06 DIAGNOSIS — Z3202 Encounter for pregnancy test, result negative: Secondary | ICD-10-CM

## 2016-10-06 NOTE — Progress Notes (Signed)
   Subjective:    Patient ID: Tracey DikeStephanie Yoder, female    DOB: November 12, 1990, 26 y.o.   MRN: [id number]  HPI 26 yo SW G0 here for a colpo due to LGSIL.   Review of Systems     Objective:   Physical Exam [id number] Tearful UPT negative, consent signed, time out done Cervix prepped with acetic acid. Transformation zone seen in its entirety. Colpo adequate. 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 I did a biopsy at the 1 o'clock position. Silver nitrate yielded hemostasis. ECC obtained. She tolerated the procedure well.      Assessment & Plan:  LGSIL pap, colpo c/w cin 1 and 2 Await pathology results

## 2016-10-07 LAB — POCT URINE PREGNANCY: PREG TEST UR: NEGATIVE

## 2016-10-14 ENCOUNTER — Telehealth: Payer: Self-pay | Admitting: *Deleted

## 2016-10-14 DIAGNOSIS — B9689 Other specified bacterial agents as the cause of diseases classified elsewhere: Secondary | ICD-10-CM

## 2016-10-14 DIAGNOSIS — N760 Acute vaginitis: Principal | ICD-10-CM

## 2016-10-14 MED ORDER — METRONIDAZOLE 500 MG PO TABS: 500.0000 mg | ORAL_TABLET | Freq: Two times a day (BID) | ORAL | 0 refills | Status: DC

## 2016-10-14 NOTE — Telephone Encounter (Signed)
Pt called stating that when she had her colpo with Dr Marice Potterove that Dr Marice Potterove thought she had BV and would send her a RX to pharmacy.  This was never send so RX was sent today.

## 2016-10-27 ENCOUNTER — Ambulatory Visit (INDEPENDENT_AMBULATORY_CARE_PROVIDER_SITE_OTHER): Payer: 59 | Admitting: Osteopathic Medicine

## 2016-10-27 DIAGNOSIS — R002 Palpitations: Secondary | ICD-10-CM

## 2016-10-27 DIAGNOSIS — R42 Dizziness and giddiness: Secondary | ICD-10-CM | POA: Diagnosis not present

## 2016-10-27 DIAGNOSIS — R011 Cardiac murmur, unspecified: Secondary | ICD-10-CM | POA: Diagnosis not present

## 2016-10-27 LAB — COMPLETE METABOLIC PANEL WITH GFR
ALBUMIN: 4.2 g/dL (ref 3.6–5.1)
ALT: 17 U/L (ref 6–29)
AST: 20 U/L (ref 10–30)
Alkaline Phosphatase: 42 U/L (ref 33–115)
BILIRUBIN TOTAL: 0.6 mg/dL (ref 0.2–1.2)
BUN: 13 mg/dL (ref 7–25)
CALCIUM: 9.4 mg/dL (ref 8.6–10.2)
CO2: 25 mmol/L (ref 20–32)
CREATININE: 0.77 mg/dL (ref 0.50–1.10)
Chloride: 103 mmol/L (ref 98–110)
GFR, Est Non African American: >89 mL/min (ref >=60)
Glucose, Bld: 83 mg/dL (ref 65–99)
Potassium: 4.4 mmol/L (ref 3.5–5.3)
Sodium: 139 mmol/L (ref 135–146)
Total Protein: 7 g/dL (ref 6.1–8.1)

## 2016-10-27 LAB — CBC
HEMATOCRIT: 39.8 % (ref 35.0–45.0)
HEMOGLOBIN: 13.4 g/dL (ref 11.7–15.5)
MCH: 30.8 pg (ref 27.0–33.0)
MCHC: 33.7 g/dL (ref 32.0–36.0)
MCV: 91.5 fL (ref 80.0–100.0)
MPV: 9.7 fL (ref 7.5–12.5)
Platelets: 314 10*3/uL (ref 140–400)
RBC: 4.35 MIL/uL (ref 3.80–5.10)
RDW: 13.2 % (ref 11.0–15.0)
WBC: 5.2 10*3/uL (ref 3.8–10.8)

## 2016-10-27 LAB — TSH: TSH: 2.75 m[IU]/L

## 2016-10-27 NOTE — Patient Instructions (Signed)
Plan:  Labs today  Will schedule Echocardiogram  Will consider Holter Monitor  Depending on results and symptoms, will consider referral to cardiologist for second opinion  If worse or change, call us or go to hospital!

## 2016-10-27 NOTE — Progress Notes (Addendum)
HPI: Tracey DikeStephanie Blume is a 26 y.o. female  who [name], 10/27/16,  for chief complaint of:  Chief Complaint  Patient presents with  . Near Syncope     . Location/Quality: tunnel vision with standing up, lightheaded, feels weak and knees buckling  . Severity/Duration: 1 month but worse past 2 weeks  . Timing: all the time but worse with standing up or walking up stairs. Almost every day, usually more in the evening/afternoons.  . Modifying factors: Tried drinking more water, eating better. No recent changes in diet/exercise.  . Assoc signs/symptoms: palpitations. Has been seeing OBGYN for pelvic pain issues which is chronic and has had no real change other than a bit more severe.   Orthostatic VS as below. Pt was not symptomatic during this testing but was light-headed after sitting up form lying down for abdominal exam.   Reviewed recent labs: 09/2016 TSH normal, no recent CBC/CMP since 02/2016 at which point these and A1C were normal.     Past medical, surgical, social and family history reviewed: Patient Active Problem List   Diagnosis Date Noted  . Sebaceous cyst 08/04/2016  . History of gluten intolerance 07/21/2016  . IUD (intrauterine device) in place 06/04/2015  . Abnormal liver enzymes 05/22/2015  . Joint pain 04/24/2015  . General counseling and advice for contraceptive management 04/24/2015   No past surgical history on file. Social History  Substance Use Topics  . Smoking status: Never Smoker  . Smokeless tobacco: Never Used  . Alcohol use 0.0 oz/week     Comment: Occ   Family History  Problem Relation Age of Onset  . Cancer Mother        skin  . Depression Mother   . Cancer Father        skin  . Diabetes Father   . Depression Father   . Depression Sister   . Depression Brother   . Arthritis Maternal Aunt        rheumatoid  . Heart attack Maternal Grandmother   . Heart attack Maternal Aunt   .  Anemia Sister      Current medication list and allergy/intolerance information reviewed:   Current Outpatient Prescriptions  Medication Sig Dispense Refill  . Levonorgestrel (SKYLA) 13.5 MG IUD by Intrauterine route.    . metroNIDAZOLE (FLAGYL) 500 MG tablet Take 1 tablet (500 mg total) by mouth 2 (two) times daily. 14 tablet 0   No current facility-administered medications for this visit.    Allergies  Allergen Reactions  . Penicillins Other (See Comments)      Review of Systems:  Constitutional:  No  fever, no chills, No recent illness, No unintentional weight changes. +significant fatigue.   HEENT: No  headache, no vision change  Cardiac: No  chest pain, No  pressure, +palpitations, No  Orthopnea  Respiratory:  No  shortness of breath. No  Cough  Gastrointestinal: No  abdominal pain, No  nausea, No  vomiting  Musculoskeletal: No new myalgia/arthralgia  Skin: No  Rash  Hem/Onc: No  easy bruising/bleeding  Endocrine: No cold intolerance,  No heat intolerance. No polyuria/polydipsia/polyphagia. No periods - on IUD.   Neurologic: No  weakness, No  Spinning/vertigo type dizziness, No  slurred speech/focal weakness/facial droop  Psychiatric: No  concerns with depression, No  concerns with anxiety, No sleep problems, No mood problems  Exam:  Orthostatic VS for the past 24 hrs:  BP- Lying Pulse- Lying BP- Sitting Pulse- Sitting  BP- Standing at 0 minutes Pulse- Standing at 0 minutes  10/27/16 1018 117/70 84 107/67 80 120/81 93    Constitutional: VS see above. General Appearance: alert, well-developed, well-nourished, NAD  Eyes: Normal lids and conjunctive, non-icteric sclera. EOMI, PERRLA, no nystagmus  Ears, Nose, Mouth, Throat: MMM, Normal external inspection ears/nares/mouth/lips/gums.   Neck: No masses, trachea midline. No thyroid enlargement. No tenderness/mass appreciated. No lymphadenopathy  Respiratory: Normal respiratory effort. no wheeze, no rhonchi, no  rales  Cardiovascular: S1/S2 normal, no murmur, no rub/gallop auscultated. RRR. No lower extremity edema.   Gastrointestinal: Nontender, no masses. No hepatomegaly, no splenomegaly. No hernia appreciated. Bowel sounds normal. Rectal exam deferred.   Musculoskeletal: Gait normal. No clubbing/cyanosis of digits. Strength 5/5 all extremities. Grip strength equal.   Neurological: Normal balance/coordination. No tremor. No cranial nerve deficit on limited exam. Motor and sensation intact and symmetric. Cerebellar reflexes intact. See eye and MSK exam   Skin: warm, dry, intact. No rash/ulcer.Marland Kitchen    Psychiatric: Normal judgment/insight. Anxious mood and affect. Oriented x3.   EKG interpretation: Rate: 72 Rhythm: sinus No ST/T changes concerning for acute ischemia/infarct     ASSESSMENT/PLAN:   Episodic lightheadedness - Plan: CBC, COMPLETE METABOLIC PANEL WITH GFR, TSH, Magnesium, Urinalysis, Routine w reflex microscopic, ECHOCARDIOGRAM COMPLETE  Intermittent palpitations - Plan: CBC, COMPLETE METABOLIC PANEL WITH GFR, TSH, Magnesium, Urinalysis, Routine w reflex microscopic, ECHOCARDIOGRAM COMPLETE  Heart murmur - Plan: ECHOCARDIOGRAM COMPLETE    Patient Instructions  Plan:  Labs today  Will schedule Echocardiogram  Will consider Holter Monitor  Depending on results and symptoms, will consider referral to cardiologist for second opinion  If worse or change, call us or go to hospital!     Visit summary with medication list and pertinent instructions was printed for patient to review. All questions at time of visit were answered - patient instructed to contact office with any additional concerns. ER/RTC precautions were reviewed with the patient. Follow-up plan: Return if symptoms worsen or fail to improve.    Addendum:   Results for orders placed or performed in visit on 10/27/16 (from the past 72 hour(s))  CBC     Status: None   Collection Time: 10/27/16  3:29 PM  Result  Value Ref Range   WBC 5.2 3.8 - 10.8 K/uL   RBC 4.35 3.80 - 5.10 MIL/uL   Hemoglobin 13.4 11.7 - 15.5 g/dL   HCT 16.1 09.6 - 04.5 %   MCV 91.5 80.0 - 100.0 fL   MCH 30.8 27.0 - 33.0 pg   MCHC 33.7 32.0 - 36.0 g/dL   RDW 40.9 81.1 - 91.4 %   Platelets 314 140 - 400 K/uL   MPV 9.7 7.5 - 12.5 fL  COMPLETE METABOLIC PANEL WITH GFR     Status: None   Collection Time: 10/27/16  3:29 PM  Result Value Ref Range   Sodium 139 135 - 146 mmol/L   Potassium 4.4 3.5 - 5.3 mmol/L   Chloride 103 98 - 110 mmol/L   CO2 25 20 - 32 mmol/L    Comment: ** Please note change in reference range(s). **      Glucose, Bld 83 65 - 99 mg/dL   BUN 13 7 - 25 mg/dL   Creat 7.82 9.56 - 2.13 mg/dL   Total Bilirubin 0.6 0.2 - 1.2 mg/dL   Alkaline Phosphatase 42 33 - 115 U/L   AST 20 10 - 30 U/L   ALT 17 6 - 29 U/L   Total  Protein 7.0 6.1 - 8.1 g/dL   Albumin 4.2 3.6 - 5.1 g/dL   Calcium 9.4 8.6 - 96.0 mg/dL   GFR, Est African American >89 >=60 mL/min   GFR, Est Non African American >89 >=60 mL/min  TSH     Status: None   Collection Time: 10/27/16  3:29 PM  Result Value Ref Range   TSH 2.75 mIU/L    Comment:   Reference Range   > or = 20 Years  0.40-4.50   Pregnancy Range First trimester  0.26-2.66 Second trimester 0.55-2.73 Third trimester  0.43-2.91     Magnesium     Status: None   Collection Time: 10/27/16  3:29 PM  Result Value Ref Range   Magnesium 2.0 1.5 - 2.5 mg/dL  Urinalysis, Routine w reflex microscopic     Status: None   Collection Time: 10/27/16  3:29 PM  Result Value Ref Range   Color, Urine DARK YELLOW YELLOW   APPearance CLEAR CLEAR   Specific Gravity, Urine 1.023 1.001 - 1.035   pH 7.5 5.0 - 8.0   Glucose, UA NEGATIVE NEGATIVE   Bilirubin Urine NEGATIVE NEGATIVE   Ketones, ur NEGATIVE NEGATIVE   Hgb urine dipstick NEGATIVE NEGATIVE   Protein, ur NEGATIVE NEGATIVE   Nitrite NEGATIVE NEGATIVE   Leukocytes, UA NEGATIVE NEGATIVE   Episodic lightheadedness - Plan: CBC,  COMPLETE METABOLIC PANEL WITH GFR, TSH, Magnesium, Urinalysis, Routine w reflex microscopic, ECHOCARDIOGRAM COMPLETE, Holter monitor - 48 hour  Intermittent palpitations - Plan: CBC, COMPLETE METABOLIC PANEL WITH GFR, TSH, Magnesium, Urinalysis, Routine w reflex microscopic, ECHOCARDIOGRAM COMPLETE, Holter monitor - 48 hour  Heart murmur - Plan: ECHOCARDIOGRAM COMPLETE

## 2016-10-28 LAB — URINALYSIS, ROUTINE W REFLEX MICROSCOPIC
BILIRUBIN URINE: NEGATIVE
GLUCOSE, UA: NEGATIVE
HGB URINE DIPSTICK: NEGATIVE
KETONES UR: NEGATIVE
Leukocytes, UA: NEGATIVE
Nitrite: NEGATIVE
PROTEIN: NEGATIVE
Specific Gravity, Urine: 1.023 (ref 1.001–1.035)
pH: 7.5 (ref 5.0–8.0)

## 2016-10-28 LAB — MAGNESIUM: MAGNESIUM: 2 mg/dL (ref 1.5–2.5)

## 2016-10-29 NOTE — Addendum Note (Signed)
Addended by: Deirdre PippinsALEXANDER, Mando Blatz M on: 10/29/2016 07:07 AM   Modules accepted: Orders

## 2016-11-02 ENCOUNTER — Encounter: Payer: Self-pay | Admitting: Obstetrics & Gynecology

## 2016-11-02 ENCOUNTER — Ambulatory Visit (INDEPENDENT_AMBULATORY_CARE_PROVIDER_SITE_OTHER): Payer: 59 | Admitting: Obstetrics & Gynecology

## 2016-11-02 VITALS — BP 100/60 | HR 84 | Wt 136.0 lb

## 2016-11-02 DIAGNOSIS — Z23 Encounter for immunization: Secondary | ICD-10-CM | POA: Diagnosis not present

## 2016-11-02 DIAGNOSIS — R102 Pelvic and perineal pain: Secondary | ICD-10-CM

## 2016-11-02 MED ORDER — NORGESTREL-ETHINYL ESTRADIOL 0.3-30 MG-MCG PO TABS: 1.0000 | ORAL_TABLET | Freq: Every day | ORAL | 11 refills | Status: DC

## 2016-11-02 NOTE — Progress Notes (Signed)
   Subjective:    Patient ID: Verlin DikeStephanie Klatt, female    DOB: [birth year], 26 y.o.   MRN: [id number]  HPI  26 yo SW G0 here for follow up of u/s done for pelvic pain. This pain has been present for 3 months. Sex makes it worse. IBU helps. Pain is daily, comes and goes in severity. She does not have periods because of her Skyla IUD. The IUD has been in for about 2 1/2 years. She has tried OCPs in the past but couldn't remember to take them regularly.  Review of Systems     Objective:   Physical Exam IMPRESSION: Normal appearance of uterus. IUD visualized within endometrial cavity.  Normal appearance of both ovaries. No adnexal abnormality identified       Assessment & Plan:  Pelvic pain- normal u/s Possible endometriosis Add OCPs (Lo Ovral) RTC 2-3 months for follow up Gardasil #2 today

## 2016-11-03 ENCOUNTER — Encounter: Payer: Self-pay | Admitting: Osteopathic Medicine

## 2016-11-04 NOTE — Addendum Note (Signed)
Addended by: Collie SiadICHARDSON, Zygmund Passero M on: 11/04/2016 09:22 AM   Modules accepted: Orders

## 2016-11-12 ENCOUNTER — Ambulatory Visit (HOSPITAL_BASED_OUTPATIENT_CLINIC_OR_DEPARTMENT_OTHER)
Admission: RE | Admit: 2016-11-12 | Discharge: 2016-11-12 | Disposition: A | Discharge status: Home / Self Care | Payer: 59 | Source: Ambulatory Visit | Attending: Osteopathic Medicine | Admitting: Osteopathic Medicine

## 2016-11-12 DIAGNOSIS — R002 Palpitations: Secondary | ICD-10-CM | POA: Diagnosis present

## 2016-11-12 DIAGNOSIS — R42 Dizziness and giddiness: Secondary | ICD-10-CM | POA: Insufficient documentation

## 2016-11-12 DIAGNOSIS — R011 Cardiac murmur, unspecified: Secondary | ICD-10-CM | POA: Diagnosis present

## 2016-11-12 NOTE — Progress Notes (Signed)
  Echocardiogram 2D Echocardiogram has been performed.  Tracey Yoder 11/12/2016, 2:31 PM

## 2016-11-16 ENCOUNTER — Ambulatory Visit (INDEPENDENT_AMBULATORY_CARE_PROVIDER_SITE_OTHER): Payer: 59

## 2016-11-16 DIAGNOSIS — R002 Palpitations: Secondary | ICD-10-CM

## 2016-11-16 DIAGNOSIS — R42 Dizziness and giddiness: Secondary | ICD-10-CM

## 2017-01-20 ENCOUNTER — Emergency Department
Admission: EM | Admit: 2017-01-20 | Discharge: 2017-01-20 | Disposition: A | Discharge status: Home / Self Care | Payer: 59 | Source: Home / Self Care | Attending: Emergency Medicine | Admitting: Emergency Medicine

## 2017-01-20 ENCOUNTER — Encounter: Payer: Self-pay | Admitting: Emergency Medicine

## 2017-01-20 ENCOUNTER — Emergency Department (INDEPENDENT_AMBULATORY_CARE_PROVIDER_SITE_OTHER): Payer: 59

## 2017-01-20 DIAGNOSIS — M25572 Pain in left ankle and joints of left foot: Secondary | ICD-10-CM

## 2017-01-20 DIAGNOSIS — M79672 Pain in left foot: Secondary | ICD-10-CM | POA: Diagnosis not present

## 2017-01-20 NOTE — Discharge Instructions (Signed)
Take ibuprofen for pain. Alternate ice and heat to ankle. Do range of motion exercises.Wear your ASO brace as needed.

## 2017-01-20 NOTE — ED Triage Notes (Signed)
Left foot and ankle pain x 4 days after walking around in high heeled boots, denies injury

## 2017-01-20 NOTE — ED Provider Notes (Signed)
[facility]    CSN: [id number] Arrival date & time: 01/20/17  1430     History   Chief Complaint Chief Complaint  Patient presents with  . Foot Pain    HPI Tracey Yoder is a 26 y.o. female.  Patient enters with pain and discomfort in her leftankle and foot. Apparently on Saturday she walked on high heels through the evening. At the end of the evening she was having pain and swelling lateral left foot. She now has difficulty bearing weight. 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 HPI  History reviewed. No pertinent past medical history.  Patient Active Problem List   Diagnosis Date Noted  . Episodic lightheadedness 10/27/2016  . Heart murmur 10/27/2016  . Intermittent palpitations 10/27/2016  . Sebaceous cyst 08/04/2016  . History of gluten intolerance 07/21/2016  . IUD (intrauterine device) in place 06/04/2015  . Abnormal liver enzymes 05/22/2015  . Joint pain 04/24/2015  . General counseling and advice for contraceptive management 04/24/2015    History reviewed. No pertinent surgical history.  OB History    Gravida Para Term Preterm AB Living   0 0 0 0 0 0   SAB TAB Ectopic Multiple Live Births   0 0 0 0 0       Home Medications    Prior to Admission medications   Medication Sig Start Date End Date Taking? Authorizing Provider  Levonorgestrel (SKYLA) 13.5 MG IUD by Intrauterine route.    [provider]  norgestrel-ethinyl estradiol (LO/OVRAL,CRYSELLE) 0.3-30 MG-MCG tablet Take 1 tablet by mouth daily. 11/02/16   Allie Bossierove, Myra C, MD    Family History Family History  Problem Relation Age of Onset  . Cancer Mother        skin  . Depression Mother   . Cancer Father        skin  . Diabetes Father   . Depression Father   . Depression Sister   . Depression Brother   . Arthritis Maternal Aunt        rheumatoid  . Heart attack Maternal Grandmother   . Heart attack Maternal Aunt   . Anemia Sister     Social History Social History    Substance Use Topics  . Smoking status: Never Smoker  . Smokeless tobacco: Never Used  . Alcohol use 0.0 oz/week     Comment: Occ     Allergies   Penicillins   Review of Systems Review of Systems  Musculoskeletal: Positive for arthralgias, gait problem and joint swelling. Negative for back pain.     Physical Exam Triage Vital Signs ED Triage Vitals [01/20/17 1449]  Enc Vitals Group     BP 132/76     Pulse Rate 94     Resp      Temp 98.5 F (36.9 C)     Temp Source Oral     SpO2 100 %     Weight 135 lb (61.2 kg)     Height 5' (1.524 m)     Head Circumference      Peak Flow      Pain Score 4     Pain Loc      Pain Edu?      Excl. in GC?    No data found.   Updated Vital Signs BP 132/76 (BP Location: Left Arm)   Pulse 94   Temp 98.5 F (36.9 C) (Oral)   Ht 5' (1.524 m)   Wt 135 lb (61.2 kg)  SpO2 100%   BMI 26.37 kg/m   Visual Acuity Right Eye Distance:   Left Eye Distance:   Bilateral Distance:    Right Eye Near:   Left Eye Near:    Bilateral Near:     Physical Exam  Musculoskeletal:  There is swelling present just superior and posterior to the lateral malleolus. This pain is not over the Achilles tendon. There is pain over the lower portion of the lateral malleolus with significant discomfort with eversion against resistance.     UC Treatments / Results  Labs (all labs ordered are listed, but only abnormal results are displayed) Labs Reviewed - No data to display  EKG  EKG Interpretation None       Radiology Dg Ankle Complete Left  Result Date: 01/20/2017 CLINICAL DATA:  26 year old female with pain for 3 days, no known injury. EXAM: LEFT ANKLE COMPLETE - 3+ VIEW COMPARISON:  Left foot series today reported separately. FINDINGS: There is no evidence of fracture, dislocation, or joint effusion. There is no evidence of arthropathy or other focal bone abnormality. Soft tissues are unremarkable. IMPRESSION: Negative. Electronically  Signed   By: Odessa Fleming M.D.   On: 01/20/2017 15:34   Dg Foot Complete Left  Result Date: 01/20/2017 CLINICAL DATA:  26 year old female with pain for 3 days, no known injury. EXAM: LEFT FOOT - COMPLETE 3+ VIEW COMPARISON:  None. FINDINGS: There is no evidence of fracture or dislocation. There is no evidence of arthropathy or other focal bone abnormality. Soft tissues are unremarkable. IMPRESSION: Negative. Electronically Signed   By: Odessa Fleming M.D.   On: 01/20/2017 15:34    Procedures Procedures (including critical care time)  Medications Ordered in UC Medications - No data to display   Initial Impression / Assessment and Plan / UC Course  I have reviewed the triage vital signs and the nursing notes.  Pertinent labs & imaging results that were available during my care of the patient were reviewed by me and considered in my medical decision making (see chart for details). There is no evidence of fracture. She will be in an ASO brace and use ice as well as continue her ibuprofen. Her next  work day is Friday.      Final Clinical Impressions(s) / UC Diagnoses   Final diagnoses:  Pain of joint of left ankle and foot  Foot pain, left  Acute left ankle pain    New Prescriptions New Prescriptions   No medications on file     Controlled Substance Prescriptions [place] Controlled Substance Registry consulted? Not Applicable   Collene Gobble, MD 01/20/17 [phone]

## 2017-03-29 ENCOUNTER — Ambulatory Visit: Payer: 59 | Admitting: Osteopathic Medicine

## 2017-03-31 ENCOUNTER — Ambulatory Visit (INDEPENDENT_AMBULATORY_CARE_PROVIDER_SITE_OTHER): Payer: 59 | Admitting: Osteopathic Medicine

## 2017-03-31 ENCOUNTER — Ambulatory Visit (INDEPENDENT_AMBULATORY_CARE_PROVIDER_SITE_OTHER): Payer: 59

## 2017-03-31 ENCOUNTER — Encounter: Payer: Self-pay | Admitting: Osteopathic Medicine

## 2017-03-31 VITALS — BP 105/70 | HR 103 | Temp 98.1°F | Wt 132.6 lb

## 2017-03-31 DIAGNOSIS — M545 Low back pain, unspecified: Secondary | ICD-10-CM

## 2017-03-31 DIAGNOSIS — M533 Sacrococcygeal disorders, not elsewhere classified: Secondary | ICD-10-CM | POA: Diagnosis not present

## 2017-03-31 DIAGNOSIS — G8929 Other chronic pain: Secondary | ICD-10-CM

## 2017-03-31 DIAGNOSIS — R200 Anesthesia of skin: Secondary | ICD-10-CM

## 2017-03-31 DIAGNOSIS — Z23 Encounter for immunization: Secondary | ICD-10-CM

## 2017-03-31 NOTE — Patient Instructions (Signed)
We'll get some basic labs and x-rays today, I'm not suspicious of anything dangerous such as spinal cord compression, it's difficult to determine based on physical exam alone if this is something due to peripheral compression of the lateral femoral cutaneous nerve, or if this may be coming from the spine. We'll refer to neurology, this may require testing with something called an EMG which will measure the nerve activity, they may also discussed doing an MRI of the lower spine. It is reassuring that you are not experiencing any weakness with these symptoms, but I'm not sure what else to tell you about what's going on this point.

## 2017-03-31 NOTE — Progress Notes (Signed)
HPI: Tracey Yoder is a 27 y.o. female who  has no past medical history on file.  she presents to [facility] today, 03/31/17,  for chief complaint of: numbness   Numb spot on R side of thigh. Been there since 03/15/17. Starting to get back pain and concerned whether one might have anything to do with another. No trauma/injury. Area seems to be enlarging. Reports occasional tingling but mostly just numb, sensitive to deeper pressure/pain but light touch doesn't feel anything. Starts at ASIS and extends 3-4 inches wide and directly downward toward the knee 8-10 inches.     Past medical, surgical, social and family history reviewed:  Patient Active Problem List   Diagnosis Date Noted  . Episodic lightheadedness 10/27/2016  . Heart murmur 10/27/2016  . Intermittent palpitations 10/27/2016  . Sebaceous cyst 08/04/2016  . History of gluten intolerance 07/21/2016  . IUD (intrauterine device) in place 06/04/2015  . Abnormal liver enzymes 05/22/2015  . Joint pain 04/24/2015  . General counseling and advice for contraceptive management 04/24/2015    No past surgical history on file.  Social History   Tobacco Use  . Smoking status: Never Smoker  . Smokeless tobacco: Never Used  Substance Use Topics  . Alcohol use: Yes    Alcohol/week: 0.0 oz    Comment: Occ    Family History  Problem Relation Age of Onset  . Cancer Mother        skin  . Depression Mother   . Cancer Father        skin  . Diabetes Father   . Depression Father   . Depression Sister   . Depression Brother   . Arthritis Maternal Aunt        rheumatoid  . Heart attack Maternal Grandmother   . Heart attack Maternal Aunt   . Anemia Sister      Current medication list and allergy/intolerance information reviewed:    Current Outpatient Medications  Medication Sig Dispense Refill  . Levonorgestrel (SKYLA) 13.5 MG IUD by Intrauterine route.    . norgestrel-ethinyl  estradiol (LO/OVRAL,CRYSELLE) 0.3-30 MG-MCG tablet Take 1 tablet by mouth daily. 1 Package 11   No current facility-administered medications for this visit.     Allergies  Allergen Reactions  . Penicillins Other (See Comments)      Review of Systems:  Constitutional:  No  fever, no chills, No recent illness, No unintentional weight changes. No significant fatigue.   HEENT: No  headache, no vision change,  Cardiac: No  chest pain, No  pressure  Respiratory:  No  shortness of breath. No  Cough  Gastrointestinal: No  abdominal pain, chronic pelvic pain/endometriosis, No  nausea,  Musculoskeletal: No new myalgia/arthralgia  Genitourinary: No  Incontinence,  Skin: No  Rash, No other wounds/concerning lesions  Hem/Onc: No  easy bruising/bleeding, No  abnormal lymph node  Endocrine: No cold intolerance,  No heat intolerance. No polyuria/polydipsia/polyphagia   Neurologic: No  weakness, No  dizziness, No  slurred speech/focal weakness/facial droop, sensation loss as noted per history of present illness, no gait disturbance,  Psychiatric: No  concerns with depression, No  concerns with anxiety  Exam:  BP 105/70   Pulse (!) 103   Temp 98.1 F (36.7 C) (Oral)   Wt 132 lb 9.6 oz (60.1 kg)   BMI 25.90 kg/m   Constitutional: VS see above. General Appearance: alert, well-developed, well-nourished, NAD  Eyes: Normal lids and conjunctive, non-icteric sclera  Ears, Nose, Mouth,  Throat: MMM, Normal external inspection ears/nares/mouth/lips/gums.   Neck: No masses, trachea midline.   Respiratory: Normal respiratory effort. no wheeze, no rhonchi, no rales  Cardiovascular: S1/S2 normal, no murmur, no rub/gallop auscultated. RRR. No lower extremity edema.  Gastrointestinal: Nontender, no masses.   Musculoskeletal: Gait normal. No clubbing/cyanosis of digits. Negative straight leg raise bilaterally. Paraspinal tenderness lower lumbar spine and SI joints  bilaterally.  Neurological: Normal balance/coordination. No tremor. No cranial nerve deficit on limited exam. Motor and sensation intact and symmetric. Cerebellar reflexes intact. Loss of light touch sensation and vibratory sensation on right anterolateral thigh  Skin: warm, dry, intact. No rash/ulcer. No concerning nevi or subq nodules on limited exam.    Psychiatric: Normal judgment/insight. Normal mood and affect. Oriented x3.     ASSESSMENT/PLAN:   Loss of sensation of skin - Plan: CBC, COMPLETE METABOLIC PANEL WITH GFR, TSH, Vitamin B12, Ambulatory referral to Neurology  Need for influenza vaccination - Plan: Flu Vaccine QUAD 6+ mos PF IM (Fluarix Quad PF)  Chronic bilateral low back pain without sciatica - Plan: DG Lumbar Spine Complete, DG Sacrum/Coccyx    Patient Instructions  We'll get some basic labs and x-rays today, I'm not suspicious of anything dangerous such as spinal cord compression, it's difficult to determine based on physical exam alone if this is something due to peripheral compression of the lateral femoral cutaneous nerve, or if this may be coming from the spine. We'll refer to neurology, this may require testing with something called an EMG which will measure the nerve activity, they may also discussed doing an MRI of the lower spine. It is reassuring that you are not experiencing any weakness with these symptoms, but I'm not sure what else to tell you about what's going on this point.     Visit summary with medication list and pertinent instructions was printed for patient to review. All questions at time of visit were answered - patient instructed to contact office with any additional concerns. ER/RTC precautions were reviewed with the patient.   Follow-up plan: Return if symptoms worsen or fail to improve.  Note: Total time spent 25 minutes, greater than 50% of the visit was spent face-to-face counseling and coordinating care for the following: The primary  encounter diagnosis was Loss of sensation of skin. Diagnoses of Need for influenza vaccination and Chronic bilateral low back pain without sciatica were also pertinent to this visit.Marland Kitchen.  Please note: voice recognition software was used to produce this document, and typos may escape review. Please contact Dr. Lyn HollingsheadAlexander for any needed clarifications.

## 2017-04-01 LAB — COMPLETE METABOLIC PANEL WITH GFR
AG RATIO: 1.4 (calc) (ref 1.0–2.5)
ALKALINE PHOSPHATASE (APISO): 33 U/L (ref 33–115)
ALT: 14 U/L (ref 6–29)
AST: 17 U/L (ref 10–30)
Albumin: 4.1 g/dL (ref 3.6–5.1)
BILIRUBIN TOTAL: 0.4 mg/dL (ref 0.2–1.2)
BUN: 12 mg/dL (ref 7–25)
CHLORIDE: 103 mmol/L (ref 98–110)
CO2: 26 mmol/L (ref 20–32)
Calcium: 9.5 mg/dL (ref 8.6–10.2)
Creat: 0.79 mg/dL (ref 0.50–1.10)
GFR, EST AFRICAN AMERICAN: 121 mL/min/{1.73_m2} (ref >=60)
GFR, Est Non African American: 105 mL/min/{1.73_m2} (ref >=60)
GLUCOSE: 102 mg/dL — AB (ref 65–99)
Globulin: 3 g/dL (calc) (ref 1.9–3.7)
POTASSIUM: 4.1 mmol/L (ref 3.5–5.3)
Sodium: 136 mmol/L (ref 135–146)
TOTAL PROTEIN: 7.1 g/dL (ref 6.1–8.1)

## 2017-04-01 LAB — CBC
HCT: 37.2 % (ref 35.0–45.0)
Hemoglobin: 13 g/dL (ref 11.7–15.5)
MCH: 30.7 pg (ref 27.0–33.0)
MCHC: 34.9 g/dL (ref 32.0–36.0)
MCV: 87.9 fL (ref 80.0–100.0)
MPV: 10.3 fL (ref 7.5–12.5)
Platelets: 345 10*3/uL (ref 140–400)
RBC: 4.23 10*6/uL (ref 3.80–5.10)
RDW: 12 % (ref 11.0–15.0)
WBC: 8.4 10*3/uL (ref 3.8–10.8)

## 2017-04-01 LAB — TSH: TSH: 2.39 mIU/L

## 2017-04-01 LAB — VITAMIN B12: VITAMIN B 12: 398 pg/mL (ref 200–1100)

## 2017-05-26 ENCOUNTER — Other Ambulatory Visit: Payer: Self-pay

## 2017-05-26 ENCOUNTER — Ambulatory Visit: Payer: 59 | Admitting: Neurology

## 2017-05-26 ENCOUNTER — Encounter: Payer: Self-pay | Admitting: Neurology

## 2017-05-26 VITALS — BP 111/58 | HR 72 | Ht 60.0 in | Wt 130.0 lb

## 2017-05-26 DIAGNOSIS — R202 Paresthesia of skin: Secondary | ICD-10-CM | POA: Diagnosis not present

## 2017-05-26 DIAGNOSIS — R1031 Right lower quadrant pain: Secondary | ICD-10-CM | POA: Diagnosis not present

## 2017-05-26 MED ORDER — GABAPENTIN 300 MG PO CAPS: 300.0000 mg | ORAL_CAPSULE | Freq: Two times a day (BID) | ORAL | 3 refills | Status: DC

## 2017-05-26 NOTE — Patient Instructions (Addendum)
   We will get MRI of the pelvis.   We will start gabapentin 300 mg capsules for the discomfort.   Start gabapentin 300 mg at night for 1 week, then take one twice a day.  Neurontin (gabapentin) may result in drowsiness, ankle swelling, gait instability, or possibly dizziness. Please contact our office if significant side effects occur with this medication.

## 2017-05-26 NOTE — Progress Notes (Signed)
Reason for visit: Right inguinal pain  Referring physician: Dr. Kathlynn Grate is a 27 y.o. female  History of present illness:  Tracey Yoder is a 27 year old right-handed white female with a history of onset of pain in the right groin that began in the summer 2018.  This pain has persisted, and just before Christmas of 2018 she noted some numbness in the proximal portions of the medial thigh with an area of numbness about the size of her palm.  The numbness does not include the genital area.  The patient does have increased discomfort during sexual intercourse.  Otherwise, walking does not worsen the pain.  The patient does have some back pain, but she denies any pain radiating around to the leg or down the leg.  The patient reports no weakness.  The patient has had a pelvic ultrasound that was unremarkable.  The left side does not create any discomfort whatsoever.  The patient has no problems with neck pain or pain down the arms.  She reports no numbness in the feet.  The patient will take ibuprofen which seems to help the discomfort.  The patient denies any problems controlling the bowels or the bladder.  She is sent to this office for further evaluation.  Past Medical History:  Diagnosis Date  . Anxiety   . Depression     Past Surgical History:  Procedure Laterality Date  . NO PAST SURGERIES      Family History  Problem Relation Age of Onset  . Cancer Mother        skin  . Depression Mother   . Cancer Father        skin  . Diabetes Father   . Depression Father   . Depression Sister   . Depression Brother   . Arthritis Maternal Aunt        rheumatoid  . Heart attack Maternal Grandmother   . Heart attack Maternal Aunt   . Anemia Sister     Social history:  reports that  has never smoked. she has never used smokeless tobacco. She reports that she drinks alcohol. She reports that she does not use drugs.  Medications:  Prior to Admission medications     Medication Sig Start Date End Date Taking? Authorizing Provider  gabapentin (NEURONTIN) 300 MG capsule Take 1 capsule (300 mg total) by mouth 2 (two) times daily. 05/26/17   York Spaniel, MD  Levonorgestrel (SKYLA) 13.5 MG IUD by Intrauterine route.    [provider]  norgestrel-ethinyl estradiol (LO/OVRAL,CRYSELLE) 0.3-30 MG-MCG tablet Take 1 tablet by mouth daily. 11/02/16   Allie Bossier, MD      Allergies  Allergen Reactions  . Penicillins Other (See Comments)    ROS:  Out of a complete 14 system review of symptoms, the patient complains only of the following symptoms, and all other reviewed systems are negative.  Numbness, groin pain  Blood pressure (!) 111/58, pulse 72, height 5' (1.524 m), weight 130 lb (59 kg).  Physical Exam  General: The patient is alert and cooperative at the time of the examination.  Eyes: Pupils are equal, round, and reactive to light. Discs are flat bilaterally.  Neck: The neck is supple, no carotid bruits are noted.  Respiratory: The respiratory examination is clear.  Cardiovascular: The cardiovascular examination reveals a regular rate and rhythm, no obvious murmurs or rubs are noted.  Neuromuscular: Internal and external rotation of the right hip does result in discomfort in  the right groin.  No pain was noted on the left side.  Range of movement low back is full.  Skin: Extremities are without significant edema.  Neurologic Exam  Mental status: The patient is alert and oriented x 3 at the time of the examination. The patient has apparent normal recent and remote memory, with an apparently normal attention span and concentration ability.  Cranial nerves: Facial symmetry is present. There is good sensation of the face to pinprick and soft touch bilaterally. The strength of the facial muscles and the muscles to head turning and shoulder shrug are normal bilaterally. Speech is well enunciated, no aphasia or dysarthria is noted.  Extraocular movements are full. Visual fields are full. The tongue is midline, and the patient has symmetric elevation of the soft palate. No obvious hearing deficits are noted.  Motor: The motor testing reveals 5 over 5 strength of all 4 extremities. Good symmetric motor tone is noted throughout.  Sensory: Sensory testing is intact to pinprick, soft touch, vibration sensation, and position sense on all 4 extremities. No evidence of extinction is noted.  Coordination: Cerebellar testing reveals good finger-nose-finger and heel-to-shin bilaterally.  Gait and station: Gait is normal. Tandem gait is normal. Romberg is negative. No drift is seen.  Reflexes: Deep tendon reflexes are symmetric and normal bilaterally. Toes are downgoing bilaterally.   Assessment/Plan:  1.  Right genitofemoral neuropathy  The patient has a history fully consistent with a genitofemoral neuropathy mainly affecting the femoral branch of the nerve.  The patient has numbness in the distribution of the genitofemoral nerve.  Internal and external rotation of the hip can worsen this neuropathy pain.  The patient will be placed on gabapentin.  The patient has not had any pelvic surgeries.  Rarely, endometriosis can cause a genitofemoral neuropathy.  The patient will undergo MRI evaluation of the pelvis to include the hip area.  The patient will follow-up in 3-4 months.  Marlan Palau. Keith Willis MD 05/26/2017 8:51 AM  [facility] [address] [place], [place] [id number]  Phone [phone] Fax [phone]

## 2017-05-31 ENCOUNTER — Telehealth: Payer: Self-pay | Admitting: Osteopathic Medicine

## 2017-05-31 NOTE — Telephone Encounter (Signed)
No, pt needs a 30 min appointment.

## 2017-05-31 NOTE — Telephone Encounter (Signed)
Pt scheduled a 15 min  appointment via mychart for removal of  2 cysts on her scalp, is this okay?

## 2017-06-02 ENCOUNTER — Ambulatory Visit
Admission: RE | Admit: 2017-06-02 | Discharge: 2017-06-02 | Disposition: A | Discharge status: Home / Self Care | Payer: 59 | Source: Ambulatory Visit | Attending: Neurology | Admitting: Neurology

## 2017-06-02 ENCOUNTER — Telehealth: Payer: Self-pay | Admitting: Neurology

## 2017-06-02 DIAGNOSIS — R202 Paresthesia of skin: Secondary | ICD-10-CM

## 2017-06-02 DIAGNOSIS — R1031 Right lower quadrant pain: Secondary | ICD-10-CM

## 2017-06-02 MED ORDER — GADOBENATE DIMEGLUMINE 529 MG/ML IV SOLN
12.0000 mL | Freq: Once | INTRAVENOUS | Status: AC | PRN
  Administered 2017-06-02: 12 mL via INTRAVENOUS

## 2017-06-02 NOTE — Telephone Encounter (Signed)
I called the patient.  The MRI of the pelvis is normal.  The patient will call for any dose adjustments of the gabapentin.  If the gabapentin is not tolerated or is not effective, we may consider trying to get somebody to inject the genitofemoral nerve and do a nerve block to see if this would help the pain.

## 2017-06-09 ENCOUNTER — Ambulatory Visit: Payer: Self-pay | Admitting: Sports Medicine

## 2017-06-10 ENCOUNTER — Ambulatory Visit (INDEPENDENT_AMBULATORY_CARE_PROVIDER_SITE_OTHER): Payer: 59 | Admitting: Sports Medicine

## 2017-06-10 ENCOUNTER — Encounter: Payer: Self-pay | Admitting: Sports Medicine

## 2017-06-10 DIAGNOSIS — L723 Sebaceous cyst: Secondary | ICD-10-CM

## 2017-06-10 NOTE — Assessment & Plan Note (Addendum)
Removal of 2 additional sebaceous cysts from the scalp, primary closure, return in 1 week for suture removal.

## 2017-06-10 NOTE — Progress Notes (Signed)
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  They are unpleasant, minimally tender, moderate, persistent.  Pain is nonradiating.  No constitutional symptoms, no GI symptoms.  No visual changes, no head trauma.  I reviewed the past medical history, family history, social history, surgical history, and allergies today and no changes were needed.  Please see the problem list section below in epic for further details.  Past Medical History: Past Medical History:  Diagnosis Date  . Anxiety   . Depression    Past Surgical History: Past Surgical History:  Procedure Laterality Date  . NO PAST SURGERIES     Social History: Social History   Socioeconomic History  . Marital status: Single    Spouse name: Not on file  . Number of children: 0  . Years of education: BA  . Highest education level: Not on file  Occupational History  . Occupation: EMS  Social Needs  . Financial resource strain: Not on file  . Food insecurity:    Worry: Not on file    Inability: Not on file  . Transportation needs:    Medical: Not on file    Non-medical: Not on file  Tobacco Use  . Smoking status: Never Smoker  . Smokeless tobacco: Never Used  Substance and Sexual Activity  . Alcohol use: Yes    Alcohol/week: 0.0 oz    Comment: Occ  . Drug use: No  . Sexual activity: Yes    Birth control/protection: IUD  Lifestyle  . Physical activity:    Days per week: Not on file    Minutes per session: Not on file  . Stress: Not on file  Relationships  . Social connections:    Talks on phone: Not on file    Gets together: Not on file    Attends religious service: Not on file    Active member of club or organization: Not on file    Attends meetings of clubs or organizations: Not on file    Relationship status: Not on file  Other Topics Concern  . Not on file   Social History Narrative   Lives alone   Caffeine use: 10-2 cup coffee per day   Right handed    Family History: Family History  Problem Relation Age of Onset  . Cancer Mother        skin  . Depression Mother   . Cancer Father        skin  . Diabetes Father   . Depression Father   . Depression Sister   . Depression Brother   . Arthritis Maternal Aunt        rheumatoid  . Heart attack Maternal Grandmother   . Heart attack Maternal Aunt   . Anemia Sister    Allergies: Allergies  Allergen Reactions  . Penicillins Other (See Comments)   Medications: See med rec.  Review of Systems: No fevers, chills, night sweats, weight loss, chest pain, or shortness of breath.   Objective:    General: Well Developed, well nourished, and in no acute distress.  Neuro: Alert and oriented x3, extra-ocular muscles intact, sensation grossly intact.  HEENT: Normocephalic, atraumatic, pupils equal round reactive to light, neck supple, no masses, no lymphadenopathy, thyroid nonpalpable.  Skin: Warm and dry, no rashes. Cardiac: Regular rate and rhythm, no murmurs rubs  or gallops, no lower extremity edema.  Respiratory: Clear to auscultation bilaterally. Not using accessory muscles, speaking in full sentences. Scalp: There are 2 palpable masses approximately 1 cm across, well-defined, movable.  Procedure:  Excision of scalp sebaceous cyst x2 Risks, benefits, and alternatives explained and consent obtained. Time out conducted. Surface prepped with alcohol. 2cc lidocaine with epinephine infiltrated in a field block. Adequate anesthesia ensured. Area prepped and draped in a sterile fashion. Excision performed with: For both sebaceous cyst I made a linear incision with a #15 blade, then using sharp and blunt dissection I was able to remove the cyst en bloc, I closed one incision with a horizontal mattress 3-0  Prolene suture, and the second incision was closed with a 3-0 simple interrupted  suture. Hemostasis achieved. Pt stable.  Impression and Recommendations:    Sebaceous cyst Removal of 2 additional sebaceous cysts from the scalp, primary closure, return in 1 week for suture removal. ___________________________________________ Ihor Austin. Benjamin Stain, M.D., ABFM., CAQSM. Primary Care and Sports Medicine [place] [facility]  Adjunct Instructor of Family Medicine  [employer]

## 2017-06-15 ENCOUNTER — Encounter: Payer: Self-pay | Admitting: Sports Medicine

## 2017-06-15 ENCOUNTER — Ambulatory Visit: Payer: 59 | Admitting: Sports Medicine

## 2017-06-15 DIAGNOSIS — L723 Sebaceous cyst: Secondary | ICD-10-CM

## 2017-06-15 NOTE — Progress Notes (Signed)
  Subjective: 1 week post scalp sebaceous cyst x2.  Objective: General: Well-developed, well-nourished, and in no acute distress. Scalp: Incisions are clean, dry, intact, sutures removed.  Assessment/plan:   Sebaceous cyst Doing extremely well post sebaceous cyst excision with primary closure, sutures removed, return as needed. ___________________________________________ Ihor Austinhomas J. Benjamin Stainhekkekandam, M.D., ABFM., CAQSM. Primary Care and Sports Medicine [place] [facility]  Adjunct Instructor of Family Medicine  [employer]

## 2017-06-15 NOTE — Assessment & Plan Note (Signed)
Doing extremely well post sebaceous cyst excision with primary closure, sutures removed, return as needed.

## 2017-06-16 ENCOUNTER — Ambulatory Visit: Payer: 59 | Admitting: Sports Medicine

## 2017-07-26 ENCOUNTER — Encounter: Payer: Self-pay | Admitting: Physician Assistant

## 2017-07-26 ENCOUNTER — Ambulatory Visit: Payer: 59 | Admitting: Physician Assistant

## 2017-07-26 VITALS — BP 120/76 | HR 86 | Wt 121.0 lb

## 2017-07-26 DIAGNOSIS — F419 Anxiety disorder, unspecified: Secondary | ICD-10-CM | POA: Diagnosis not present

## 2017-07-26 DIAGNOSIS — F329 Major depressive disorder, single episode, unspecified: Secondary | ICD-10-CM

## 2017-07-26 MED ORDER — ALPRAZOLAM 0.5 MG PO TABS: 0.5000 mg | ORAL_TABLET | Freq: Three times a day (TID) | ORAL | 0 refills | Status: DC | PRN

## 2017-07-26 NOTE — Progress Notes (Signed)
HPI:                                                                Tracey Yoder is a 27 y.o. female who presents to [facility]: Primary Care Sports Medicine today for anxiety and depression  Depression       The patient presents with depression.  This is a recurrent problem.  The current episode started in the past 7 days.   The onset quality is gradual.   The problem occurs constantly.The problem is unchanged.  Associated symptoms include decreased concentration, hopelessness, irritable, restlessness, decreased interest, sad and suicidal ideas.     Exacerbated by: break-up.  Past treatments include SSRIs - Selective serotonin reuptake inhibitors.  Past compliance problems include medication issues.  Previous treatment provided no relief relief.  Risk factors include history of self-injury and history of suicide attempt.   Past medical history includes depression and suicide attempts.   Recent break-up with partner yesterday. Reports history of cutting and overdosing on tylenol as a teenager. No psychiatric hospitalizations. No substance abuse. No history of self-harming behaviors in the last 5 years. Having passive suicidal ideation, no plan. Has been on Zoloft and Prozac in the past, but reports adverse reactions to SSRI medication. She is interested in counseling. Lives at home alone. Has a sister who lives nearby and she is currently staying with. Working full-time as a Radiation protection practitioner.  Depression screen [facility] 2/9 07/26/2017 03/31/2017 04/23/2015  Decreased Interest 2 0 0  Down, Depressed, Hopeless 1 0 0  PHQ - 2 Score 3 0 0  Altered sleeping 2 0 -  Tired, decreased energy 1 0 -  Change in appetite 1 0 -  Feeling bad or failure about yourself  3 - -  Trouble concentrating 2 0 -  Moving slowly or fidgety/restless 1 0 -  Suicidal thoughts 2 0 -  PHQ-9 Score 15 0 -  Difficult doing work/chores Very difficult - -    GAD 7 : Generalized Anxiety Score 07/26/2017  Nervous,  Anxious, on Edge 2  Control/stop worrying 3  Worry too much - different things 3  Trouble relaxing 2  Restless 1  Easily annoyed or irritable 1  Afraid - awful might happen 2  Total GAD 7 Score 14  Anxiety Difficulty Very difficult      Past Medical History:  Diagnosis Date  . Anxiety   . Depression    Past Surgical History:  Procedure Laterality Date  . NO PAST SURGERIES     Social History   Tobacco Use  . Smoking status: Never Smoker  . Smokeless tobacco: Never Used  Substance Use Topics  . Alcohol use: Yes    Alcohol/week: 0.0 oz    Comment: Occ   family history includes Anemia in her sister; Arthritis in her maternal aunt; Cancer in her father and mother; Depression in her brother, father, mother, and sister; Diabetes in her father; Heart attack in her maternal aunt and maternal grandmother.    ROS: negative except as noted in the HPI  Medications: Current Outpatient Medications  Medication Sig Dispense Refill  . gabapentin (NEURONTIN) 300 MG capsule Take 1 capsule (300 mg total) by mouth 2 (two) times daily. 60 capsule 3  . Levonorgestrel (SKYLA) 13.5  MG IUD by Intrauterine route.    . norgestrel-ethinyl estradiol (LO/OVRAL,CRYSELLE) 0.3-30 MG-MCG tablet Take 1 tablet by mouth daily. 1 Package 11  . ALPRAZolam (XANAX) 0.5 MG tablet Take 1 tablet (0.5 mg total) by mouth every 8 (eight) hours as needed for anxiety. 20 tablet 0   No current facility-administered medications for this visit.    Allergies  Allergen Reactions  . Penicillins Other (See Comments)       Objective:  BP 120/76   Pulse 86   Wt 121 lb (54.9 kg)   BMI 23.63 kg/m  Gen:  alert, not ill-appearing, no distress, appropriate for age HEENT: head normocephalic without obvious abnormality, conjunctiva and cornea clear, trachea midline Pulm: Normal work of breathing, normal phonation, clear to auscultation bilaterally, no wheezes, rales or rhonchi CV: Normal rate, regular rhythm, s1 and  s2 distinct, no murmurs, clicks or rubs  Neuro: alert and oriented x 3, no tremor MSK: extremities atraumatic, normal gait and station Skin: intact, no rashes on exposed skin, no jaundice, no cyanosis Psych: well-groomed, cooperative, good eye contact, depressed mood, tearful, affect mood-congruent, speech is articulate, and thought processes clear and goal-directed    No results found for this or any previous visit (from the past 72 hour(s)). No results found.    Assessment and Plan: 27 y.o. female with   Anxiety and depression - Plan: ALPRAZolam (XANAX) 0.5 MG tablet, Ambulatory referral to Psychiatry - PHQ9=15, moderate major depressive episode, no acute safety issues, patient verbally contracted for safety. Good insight and social supports. Safety plan on AVS - GAD7=14, moderately severe - Xanax 0.5 mg prn for anxiety since there is an adjustment component - urgent referral placed for counseling   Patient education and anticipatory guidance given Patient agrees with treatment plan Follow-up with PCP in 2 weeks for anxiety and mood or sooner as needed if symptoms worsen or fail to improve  Levonne Hubert PA-C

## 2017-07-26 NOTE — Patient Instructions (Signed)
Counseling: - psychologytoday.com: search engine to locate local counselors - Family Services in your county offer counseling on a sliding scale (pay what you can afford) - [facility]: we can place a referral for you to see one of licensed counselors in [place], [place], or [place] - online counseling: BetterHelp and [employer] (not covered by insurance, but affordable self-pay rates)  Other resources: - everydayhealth.com/depression/guide/resources/ - mindbodygreen.com - 7cupsoftea - greatist.com/grow/resources-when-you-can-not-afford-therapy  Safety Plan: if having self-harm or suicidal thoughts Our Office 336-992-1770 [facility] Crisis Hotline 336-832-9700 National Suicide Hotline 1-800-SUICIDE If in immediate danger of harming yourself, go to the nearest emergency room or call 911       

## 2017-07-27 ENCOUNTER — Ambulatory Visit (INDEPENDENT_AMBULATORY_CARE_PROVIDER_SITE_OTHER): Payer: 59 | Admitting: Licensed Clinical Social Worker

## 2017-07-27 DIAGNOSIS — F4323 Adjustment disorder with mixed anxiety and depressed mood: Secondary | ICD-10-CM

## 2017-07-27 DIAGNOSIS — F4010 Social phobia, unspecified: Secondary | ICD-10-CM

## 2017-07-27 DIAGNOSIS — F331 Major depressive disorder, recurrent, moderate: Secondary | ICD-10-CM | POA: Diagnosis not present

## 2017-07-28 ENCOUNTER — Encounter (HOSPITAL_COMMUNITY): Payer: Self-pay | Admitting: Licensed Clinical Social Worker
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
## 2017-07-31 ENCOUNTER — Other Ambulatory Visit: Payer: Self-pay | Admitting: Obstetrics & Gynecology

## 2017-08-05 ENCOUNTER — Ambulatory Visit (INDEPENDENT_AMBULATORY_CARE_PROVIDER_SITE_OTHER): Payer: 59 | Admitting: Licensed Clinical Social Worker

## 2017-08-05 DIAGNOSIS — F331 Major depressive disorder, recurrent, moderate: Secondary | ICD-10-CM | POA: Diagnosis not present

## 2017-08-05 DIAGNOSIS — F4010 Social phobia, unspecified: Secondary | ICD-10-CM | POA: Diagnosis not present

## 2017-08-05 DIAGNOSIS — F4323 Adjustment disorder with mixed anxiety and depressed mood: Secondary | ICD-10-CM

## 2017-08-05 NOTE — Progress Notes (Signed)
   THERAPIST PROGRESS NOTE  Session Time: 2:06pm-3:05pm  Participation Level: Active  Behavioral Response: CasualAlertAnxious  Type of Therapy: Individual Therapy  Treatment Goals addressed: Change thoughts to be more positive and realistic Enhance ability to handle effectively the full variety of life's anxieties  Interventions: CBT  Suicidal/Homicidal: Denied both  Therapist Interventions: Collaborated with patient to develop her treatment plan.  Decided to focus on reducing anxiety at the beginning of treatment.  Briefly described some of the interventions she can expect as she participates in therapy. Talked about how in order to heal you have to be willing to be vulnerable.  Discussed how she was vulnerable in talking with her boyfriend.  Acknowledged how it feels good to get validation of your thoughts and feelings from another person. Introduced the cognitive model.  Emphasized that it is our thoughts about a situation that determine how we end up feeling and what we do about how we are feeling.  Introduced a Dispensing optician which can be used to increase awareness of triggers, thoughts, feelings, and behaviors.  Guided patient through completing one example.  Assigned homework to complete one entry each day and bring it back in for discussion at her next session.      Summary: Indicated she thought some of the interventions described could be helpful. Reported she got back together with her boyfriend after they had a meaningful conversation.  Acknowledged she had been pushing him away, but realizes now that isn't what she really wants.  Talked about how in order to connect she had to be vulnerable. Indicated she is open to using the Thought Journal and commented on how she thought it could be helpful.     Plan: Scheduled to return next week.  Will review homework. Treatment plan review is due 11/05/17  Diagnosis:  MDD, recurrent, moderate                      Adjustment  Disorder with mixed anxiety and depressed mood                     Social Anxiety Disorder         Darrin Luis 08/05/2017

## 2017-08-09 ENCOUNTER — Encounter: Payer: Self-pay | Admitting: Physician Assistant

## 2017-08-09 ENCOUNTER — Ambulatory Visit: Payer: 59 | Admitting: Physician Assistant

## 2017-08-09 VITALS — BP 104/67 | HR 72 | Wt 123.0 lb

## 2017-08-09 DIAGNOSIS — F419 Anxiety disorder, unspecified: Secondary | ICD-10-CM

## 2017-08-09 DIAGNOSIS — F32A Depression, unspecified: Secondary | ICD-10-CM

## 2017-08-09 DIAGNOSIS — F329 Major depressive disorder, single episode, unspecified: Secondary | ICD-10-CM

## 2017-08-09 NOTE — Progress Notes (Signed)
HPI:                                                                Tracey Yoder is a 27 y.o. female who presents to [facility] today for anxiety and depression follow-up  Patient presented 2 weeks ago with acute adjustment reaction to recent breakup. She is doing much better today. She was given low-dose Alprazolam. Reports taking 1/2 dose the first few days and has no longer needed any anti-anxiety medication. She has had 2 therapy appointments. She has worked things out with her partner and they are dating again.    Depression screen [facility] 2/9 08/09/2017 07/26/2017 03/31/2017 04/23/2015  Decreased Interest 0 2 0 0  Down, Depressed, Hopeless 0 1 0 0  PHQ - 2 Score 0 3 0 0  Altered sleeping 1 2 0 -  Tired, decreased energy 1 1 0 -  Change in appetite 1 1 0 -  Feeling bad or failure about yourself  1 3 - -  Trouble concentrating 0 2 0 -  Moving slowly or fidgety/restless 0 1 0 -  Suicidal thoughts 0 2 0 -  PHQ-9 Score 4 15 0 -  Difficult doing work/chores - Very difficult - -    GAD 7 : Generalized Anxiety Score 08/09/2017 07/26/2017  Nervous, Anxious, on Edge 1 2  Control/stop worrying 2 3  Worry too much - different things 2 3  Trouble relaxing 1 2  Restless 0 1  Easily annoyed or irritable 1 1  Afraid - awful might happen 1 2  Total GAD 7 Score 8 14  Anxiety Difficulty - Very difficult      Past Medical History:  Diagnosis Date  . Anxiety   . Depression    Past Surgical History:  Procedure Laterality Date  . NO PAST SURGERIES     Social History   Tobacco Use  . Smoking status: Never Smoker  . Smokeless tobacco: Never Used  Substance Use Topics  . Alcohol use: Yes    Alcohol/week: 0.0 oz    Comment: Occ   family history includes Anemia in her sister; Arthritis in her maternal aunt; Cancer in her father and mother; Depression in her brother, father, mother, and sister; Diabetes in her father; Heart attack in her  maternal aunt and maternal grandmother.    ROS: negative except as noted in the HPI  Medications: Current Outpatient Medications  Medication Sig Dispense Refill  . ALPRAZolam (XANAX) 0.5 MG tablet Take 1 tablet (0.5 mg total) by mouth every 8 (eight) hours as needed for anxiety. 20 tablet 0  . CRYSELLE-28 0.3-30 MG-MCG tablet TAKE 1 TABLET BY MOUTH EVERY DAY 28 tablet 11  . gabapentin (NEURONTIN) 300 MG capsule Take 1 capsule (300 mg total) by mouth 2 (two) times daily. 60 capsule 3  . Levonorgestrel (SKYLA) 13.5 MG IUD by Intrauterine route.     No current facility-administered medications for this visit.    Allergies  Allergen Reactions  . Penicillins Other (See Comments)       Objective:  BP 104/67   Pulse 72   Wt 123 lb (55.8 kg)   BMI 24.02 kg/m  Gen:  alert, not ill-appearing, no distress, appropriate for age HEENT: head  normocephalic without obvious abnormality, conjunctiva and cornea clear, trachea midline Pulm: Normal work of breathing, normal phonation Neuro: alert and oriented x 3, no tremor MSK: extremities atraumatic, normal gait and station Skin: intact, no rashes on exposed skin, no jaundice, no cyanosis Psych: well-groomed, cooperative, good eye contact, depressed mood, tearful, affect mood-congruent, speech is articulate, and thought processes clear and goal-directed    No results found for this or any previous visit (from the past 72 hour(s)). No results found.    Assessment and Plan: 27 y.o. female with   Anxiety and depression  PHQ2=0, PHQ9=4, significantly improved with CBT and short-term use of benzodiazepine Passive SI has resolved Continue CBT  Patient education and anticipatory guidance given Patient agrees with treatment plan Follow-up as needed if symptoms worsen or fail to improve  Levonne Hubert PA-C

## 2017-08-13 ENCOUNTER — Ambulatory Visit (INDEPENDENT_AMBULATORY_CARE_PROVIDER_SITE_OTHER): Payer: 59 | Admitting: Licensed Clinical Social Worker

## 2017-08-13 DIAGNOSIS — F4010 Social phobia, unspecified: Secondary | ICD-10-CM

## 2017-08-13 DIAGNOSIS — F331 Major depressive disorder, recurrent, moderate: Secondary | ICD-10-CM | POA: Diagnosis not present

## 2017-08-13 NOTE — Progress Notes (Signed)
   THERAPIST PROGRESS NOTE  Session Time: 9:04am-10:06am  Participation Level: Active  Behavioral Response: CasualAlertAnxious  Type of Therapy: Individual Therapy  Treatment Goals addressed: Change thoughts to be more positive and realistic Enhance ability to handle effectively the full variety of life's anxieties  Interventions: CBT  Suicidal/Homicidal: Denied both  Therapist Interventions: Reviewed patient's thought journal entries.  Examined patterns in her thinking, commonalities among her triggers, and similarities in how she coped with her distressing feelings. Guided patient through the process of challenging her thoughts, changing them to be more positive or realistic.  Introduced Public affairs consultant questioning as a method for coming up with alternative thoughts.  Prompted her to consider how changing her thoughts may have changed her feelings and behavior. Assigned homework to complete 3 thought journal entries along with alternative thoughts, feelings, behaviors, and consequences      Summary:  Completed entries correctly.  Negative thoughts often centered on being overly critical of herself.  Half of the situations she wrote about were work related.  Talked about how it is hard for her to be in a new position and have to learn through experience.  In almost all the situations she held onto her negative thoughts and this intensified and prolonged those feelings.    Noted how doing the homework helped her to become more aware of how her thinking was affecting her mood.   Acknowledged coming up with alternative thoughts is difficult and will take practice to become more proficient at it.        Plan: Scheduled to return 08/23/17.  Will review homework.  May introduce Mindfulness   Treatment plan review is due 11/05/17  Diagnosis:  MDD, recurrent, moderate                      Social Anxiety Disorder         Darrin Luis 08/13/2017

## 2017-08-23 ENCOUNTER — Ambulatory Visit (INDEPENDENT_AMBULATORY_CARE_PROVIDER_SITE_OTHER): Payer: 59 | Admitting: Licensed Clinical Social Worker

## 2017-08-23 DIAGNOSIS — F331 Major depressive disorder, recurrent, moderate: Secondary | ICD-10-CM | POA: Diagnosis not present

## 2017-08-23 DIAGNOSIS — F4010 Social phobia, unspecified: Secondary | ICD-10-CM
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
## 2017-09-02 ENCOUNTER — Ambulatory Visit (INDEPENDENT_AMBULATORY_CARE_PROVIDER_SITE_OTHER): Payer: 59 | Admitting: Licensed Clinical Social Worker

## 2017-09-02 DIAGNOSIS — F330 Major depressive disorder, recurrent, mild: Secondary | ICD-10-CM | POA: Diagnosis not present

## 2017-09-02 DIAGNOSIS — F4010 Social phobia, unspecified: Secondary | ICD-10-CM | POA: Diagnosis not present

## 2017-09-02 NOTE — Progress Notes (Signed)
   THERAPIST PROGRESS NOTE  Session Time: 1:06pm-2:00pm  Participation Level: Active  Behavioral Response: Casual  Alert  Euthymic  Type of Therapy: Individual Therapy  Treatment Goals addressed: Change thoughts to be more positive and realistic Enhance ability to handle effectively the full variety of life's anxieties  Interventions: Mindfulness  Suicidal/Homicidal: Denied both  Therapist Interventions: Introduced the concept of mindfulness.  Emphasized how learning to focus on the present can help you to feel more in control of your emotions.  Explained how it can be useful to practice at times when you catch yourself having unhelpful thoughts.  Described how you can practice mindfulness by doing tasks in a mindful way, focusing on an object, or through focused breathing.  Encouraged patient to practice the skills regularly in addition to times of distress.      Summary:  Didn't really have any experience with mindfulness or meditation.  Acknowledged she does spend quite a bit of time dwelling on the past or worrying about the future.  Indicated she thought the skills could be helpful.  When asked what time of day would likely be most appropriate to practice she said 3 or 4 in the afternoon.    Reported having a good week.  Felt relaxed most of the time.  Also felt like she accomplished quite a bit.      Plan: Will check on implementation of Mindfulness skills at next session. Treatment plan review is due 11/05/17  Diagnosis:  MDD, recurrent, mild                      Social Anxiety Disorder         Darrin LuisSolomon, Sarah A, LCSW 09/02/2017

## 2017-09-06 ENCOUNTER — Ambulatory Visit (HOSPITAL_COMMUNITY): Payer: Self-pay | Admitting: Licensed Clinical Social Worker

## 2017-09-20 ENCOUNTER — Encounter: Payer: Self-pay | Admitting: Obstetrics & Gynecology

## 2017-09-20 ENCOUNTER — Ambulatory Visit (INDEPENDENT_AMBULATORY_CARE_PROVIDER_SITE_OTHER): Payer: 59 | Admitting: Obstetrics & Gynecology

## 2017-09-20 VITALS — BP 111/66 | HR 74 | Ht 60.0 in | Wt 126.4 lb

## 2017-09-20 DIAGNOSIS — Z113 Encounter for screening for infections with a predominantly sexual mode of transmission: Secondary | ICD-10-CM | POA: Diagnosis not present

## 2017-09-20 DIAGNOSIS — Z124 Encounter for screening for malignant neoplasm of cervix: Secondary | ICD-10-CM

## 2017-09-20 DIAGNOSIS — Z01419 Encounter for gynecological examination (general) (routine) without abnormal findings: Secondary | ICD-10-CM

## 2017-09-20 NOTE — Progress Notes (Signed)
Subjective:    Tracey Yoder is a 27 y.o. single P0  female who presents for an annual exam. The patient has no complaints today. She reports that her pelvic pain is kept under control with the OCPs and the Skyla (expires 2/20). The patient is sexually active. GYN screening history: last pap: was normal. The patient wears seatbelts: yes. The patient participates in regular exercise: yes. Has the patient ever been transfused or tattooed?: no. The patient reports that there is not domestic violence in her life.   Menstrual History: OB History    Gravida  0   Para  0   Term  0   Preterm  0   AB  0   Living  0     SAB  0   TAB  0   Ectopic  0   Multiple  0   Live Births  0           Menarche age: 89 No LMP recorded. (Menstrual status: IUD).    The following portions of the patient's history were reviewed and updated as appropriate: allergies, current medications, past family history, past medical history, past social history, past surgical history and problem list.  Review of Systems Pertinent items are noted in HPI.   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[employer] In a monogamous relationship for about 1 1/2 years   Objective:    BP 111/66   Pulse 74   Ht 5' (1.524 m)   Wt 126 lb 6.4 oz (57.3 kg)   BMI 24.69 kg/m   General Appearance:    Alert, cooperative, no distress, appears stated age  Head:    Normocephalic, without obvious abnormality, atraumatic  Eyes:    PERRL, conjunctiva/corneas clear, EOM's intact, fundi    benign, both eyes  Ears:    Normal TM's and external ear canals, both ears  Nose:   Nares normal, septum midline, mucosa normal, no drainage    or sinus tenderness  Throat:   Lips, mucosa, and tongue normal; teeth and gums normal  Neck:   Supple, symmetrical, trachea midline, no adenopathy;    thyroid:  no enlargement/tenderness/nodules; no  carotid   bruit or JVD  Back:     Symmetric, no curvature, ROM normal, no CVA tenderness  Lungs:     Clear to auscultation bilaterally, respirations unlabored  Chest Wall:    No tenderness or deformity   Heart:    Regular rate and rhythm, S1 and S2 normal, no murmur, rub   or gallop  Breast Exam:    No tenderness, masses, or nipple abnormality, however, extremely fibrocystic breasts  Abdomen:     Soft, non-tender, bowel sounds active all four quadrants,    no masses, no organomegaly  Genitalia:    Normal female without lesion, discharge or tenderness, normal size and shape, anteverted, mobile, non-tender, normal adnexal exam      Extremities:   Extremities normal, atraumatic, no cyanosis or edema  Pulses:   2+ and symmetric all extremities  Skin:   Skin color, texture, turgor normal, no rashes or lesions  Lymph nodes:   Cervical, supraclavicular, and axillary nodes normal  Neurologic:   CNII-XII intact, normal strength, sensation and reflexes    throughout  .    Assessment:    Healthy female exam.    Plan:     Chlamydia specimen. GC specimen. Thin prep  Pap smear.   Rec decrease caffeine intake

## 2017-09-24 LAB — CYTOLOGY - PAP
CHLAMYDIA, DNA PROBE: NEGATIVE
DIAGNOSIS: NEGATIVE
Neisseria Gonorrhea: NEGATIVE

## 2017-10-04 ENCOUNTER — Ambulatory Visit (INDEPENDENT_AMBULATORY_CARE_PROVIDER_SITE_OTHER): Payer: 59 | Admitting: Licensed Clinical Social Worker

## 2017-10-04 DIAGNOSIS — F3341 Major depressive disorder, recurrent, in partial remission: Secondary | ICD-10-CM | POA: Diagnosis not present

## 2017-10-04 DIAGNOSIS — F4010 Social phobia, unspecified: Secondary | ICD-10-CM | POA: Diagnosis not present

## 2017-10-04 NOTE — Progress Notes (Signed)
   THERAPIST PROGRESS NOTE  Session Time: 3:05pm-3:55pm  Participation Level: Active  Behavioral Response: Casual  Alert  Euthymic  Type of Therapy: Individual Therapy  Treatment Goals addressed: Change thoughts to be more positive and realistic Enhance ability to handle effectively the full variety of life's anxieties  Interventions: Assessment, Mindfulness  Suicidal/Homicidal: Denied both  Therapist Interventions: Gathered information about significant events and changes in mood and functioning since last seen about a month ago.  Had her complete a PHQ9 and GAD7.   Assessed patient use of coping skills she has learned about in therapy. Expanded on mindfulness.  Introduced doing mind vs being mind.  Shared an eating meditation.  Talked about how practicing mindfulness can help you to get more enjoyment from pleasant experiences.      Summary:  Reported feeling good about her mood lately.  Has been busy with attending a funeral, going on vacation, and working.  [facility] 10/04/2017 08/09/2017 07/26/2017 03/31/2017 04/23/2015  Decreased Interest 0 0 2 0 0  Down, Depressed, Hopeless 1 0 1 0 0  PHQ - 2 Score 1 0 3 0 0  Altered sleeping - 1 2 0 -  Tired, decreased energy - 1 1 0 -  Change in appetite - 1 1 0 -  Feeling bad or failure about yourself  - 1 3 - -  Trouble concentrating - 0 2 0 -  Moving slowly or fidgety/restless - 0 1 0 -  Suicidal thoughts - 0 2 0 -  PHQ-9 Score - 4 15 0 -  Difficult doing work/chores - - Very difficult - -    GAD 7 : Generalized Anxiety Score 10/04/2017 08/09/2017 07/26/2017  Nervous, Anxious, on Edge 1 1 2   Control/stop worrying 1 2 3   Worry too much - different things 1 2 3   Trouble relaxing 1 1 2   Restless 0 0 1  Easily annoyed or irritable 2 1 1   Afraid - awful might happen 1 1 2   Total GAD 7 Score 7 8 14   Anxiety Difficulty Not difficult at all - Very difficult    Indicated that while she hasn't been practicing mindfulness  regularly she has been employing the concepts as needed.  Described how she has been able to catch some of her unhelpful thoughts before they completely overwhelm her.          Plan: Will schedule next appointment in approximately one month. Treatment plan review is due 11/05/17  Diagnosis:   Social Anxiety Disorder  MDD, recurrent, in partial remission        Darrin LuisSolomon, Sarah A, LCSW 10/04/2017

## 2017-10-15 ENCOUNTER — Telehealth: Payer: Self-pay | Admitting: Neurology

## 2017-10-15 ENCOUNTER — Ambulatory Visit: Payer: 59 | Admitting: Neurology

## 2017-10-15 NOTE — Telephone Encounter (Signed)
This patient did not show for a revisit appointment today. 

## 2017-10-18 ENCOUNTER — Encounter: Payer: Self-pay | Admitting: Neurology

## 2017-10-21 DIAGNOSIS — Z0289 Encounter for other administrative examinations: Secondary | ICD-10-CM

## 2017-11-02 ENCOUNTER — Ambulatory Visit (HOSPITAL_COMMUNITY): Payer: 59 | Admitting: Licensed Clinical Social Worker

## 2017-11-11 ENCOUNTER — Ambulatory Visit (HOSPITAL_COMMUNITY): Payer: 59 | Admitting: Licensed Clinical Social Worker

## 2017-11-15 ENCOUNTER — Ambulatory Visit (INDEPENDENT_AMBULATORY_CARE_PROVIDER_SITE_OTHER): Payer: 59 | Admitting: Licensed Clinical Social Worker

## 2017-11-15 DIAGNOSIS — F3341 Major depressive disorder, recurrent, in partial remission: Secondary | ICD-10-CM

## 2017-11-15 DIAGNOSIS — F4010 Social phobia, unspecified: Secondary | ICD-10-CM

## 2017-11-15 NOTE — Progress Notes (Signed)
   THERAPIST PROGRESS NOTE  Session Time: 8:31am-8:51am  Participation Level: Active  Behavioral Response: Casual  Alert  Euthymic  Type of Therapy: Individual Therapy  Treatment Goals addressed: Change thoughts to be more positive and realistic Enhance ability to handle effectively the full variety of life's anxieties  Interventions: Assessment  Suicidal/Homicidal: Denied both  Therapist Interventions: Reviewed patient's treatment plan and determined she achieved her goals of improving her thinking to be more positive and realistic and managing feelings of anxiety in an effective manner.  Provided positive feedback regarding her consistent use of the coping skills she has learned.     Asked about any other concerns she would like to address in therapy.  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  She noted there is a resource through her place of employment.         Summary:  Reported her mood has been good, about the same as it was at her last appointment.  Talked about how she has used the coping skills she has learned about to help her get through difficult moments.   Described one time when she got stuck on unhelpful thoughts, but she reached out to a support and allowed herself "cry it out" and that helped her to feel much better.          Plan: No further sessions scheduled with this therapist.   Diagnosis:   Social Anxiety Disorder  MDD, recurrent, in partial remission        Darrin LuisSolomon, Dolphus Linch A, LCSW 11/15/2017

## 2017-12-13 ENCOUNTER — Ambulatory Visit (INDEPENDENT_AMBULATORY_CARE_PROVIDER_SITE_OTHER): Payer: 59 | Admitting: Sports Medicine

## 2017-12-13 ENCOUNTER — Ambulatory Visit: Payer: Self-pay | Admitting: Osteopathic Medicine

## 2017-12-13 ENCOUNTER — Encounter: Payer: Self-pay | Admitting: Sports Medicine

## 2017-12-13 DIAGNOSIS — M26609 Unspecified temporomandibular joint disorder, unspecified side: Secondary | ICD-10-CM | POA: Diagnosis not present

## 2017-12-13 MED ORDER — CELECOXIB 200 MG PO CAPS
ORAL_CAPSULE | ORAL | 2 refills | Status: DC
Start: 2017-12-13 — End: 2018-03-07

## 2017-12-13 MED ORDER — PREDNISONE 50 MG PO TABS: ORAL_TABLET | ORAL | 0 refills | Status: DC

## 2017-12-13 NOTE — Assessment & Plan Note (Signed)
Right worse than left popping. Adding prednisone. Celebrex. She does need to go to her dentist for a custom mouthguard. If none of this works over a solid month we will discuss referral to a TMJ center.

## 2017-12-13 NOTE — Progress Notes (Signed)
Subjective:    CC: Bilateral jaw pain  HPI: For several months this pleasant 27 year old female has had pain in both jaws, with clicking and catching and pain when she opens and closes her mouth.  Pain is up just over the temporal mandibular joint.  No trauma, no dislocation but occasionally she does get some catching when she opens her mouth.  Symptoms are moderate, persistent.  I reviewed the past medical history, family history, social history, surgical history, and allergies today and no changes were needed.  Please see the problem list section below in epic for further details.  Past Medical History: Past Medical History:  Diagnosis Date  . Anxiety   . Depression    Past Surgical History: Past Surgical History:  Procedure Laterality Date  . NO PAST SURGERIES     Social History: Social History   Socioeconomic History  . Marital status: Single    Spouse name: Not on file  . Number of children: 0  . Years of education: BA  . Highest education level: Not on file  Occupational History  . Occupation: EMS  Social Needs  . Financial resource strain: Not on file  . Food insecurity:    Worry: Not on file    Inability: Not on file  . Transportation needs:    Medical: Not on file    Non-medical: Not on file  Tobacco Use  . Smoking status: Never Smoker  . Smokeless tobacco: Never Used  Substance and Sexual Activity  . Alcohol use: Yes    Alcohol/week: 0.0 standard drinks    Comment: Occ  . Drug use: No  . Sexual activity: Yes    Birth control/protection: IUD  Lifestyle  . Physical activity:    Days per week: Not on file    Minutes per session: Not on file  . Stress: Not on file  Relationships  . Social connections:    Talks on phone: Not on file    Gets together: Not on file    Attends religious service: Not on file    Active member of club or organization: Not on file    Attends meetings of clubs or organizations: Not on file    Relationship status: Not on file   Other Topics Concern  . Not on file  Social History Narrative   Lives alone   Caffeine use: 10-2 cup coffee per day   Right handed    Family History: Family History  Problem Relation Age of Onset  . Cancer Mother        skin  . Depression Mother   . Cancer Father        skin  . Diabetes Father   . Depression Father   . Depression Sister   . Depression Brother   . Arthritis Maternal Aunt        rheumatoid  . Heart attack Maternal Grandmother   . Heart attack Maternal Aunt   . Anemia Sister    Allergies: Allergies  Allergen Reactions  . Penicillins Other (See Comments)   Medications: See med rec.  Review of Systems: No fevers, chills, night sweats, weight loss, chest pain, or shortness of breath.   Objective:    General: Well Developed, well nourished, and in no acute distress.  Neuro: Alert and oriented x3, extra-ocular muscles intact, sensation grossly intact.  HEENT: Normocephalic, atraumatic, pupils equal round reactive to light, neck supple, no masses, no lymphadenopathy, thyroid nonpalpable.  Oropharynx, nasopharynx, ear canals unremarkable, palpable crepitus and popping  at the temporomandibular joint as her jaws taken through its range of motion. Skin: Warm and dry, no rashes. Cardiac: Regular rate and rhythm, no murmurs rubs or gallops, no lower extremity edema.  Respiratory: Clear to auscultation bilaterally. Not using accessory muscles, speaking in full sentences.  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 Adding prednisone. Celebrex. She does need to go to her dentist for a custom mouthguard. If none of this works over a solid month we will discuss referral to a TMJ center. ___________________________________________ Ihor Austin. Benjamin Stain, M.D., ABFM., CAQSM. Primary Care and Sports Medicine [facility]  Adjunct Instructor of Family Medicine  [employer]

## 2017-12-13 NOTE — Patient Instructions (Signed)
Temporomandibular Joint Syndrome Temporomandibular joint (TMJ) syndrome is a condition that affects the joints between your jaw and your skull. The TMJs are located near your ears and allow your jaw to open and close. These joints and the nearby muscles are involved in all movements of the jaw. People with TMJ syndrome have pain in the area of these joints and muscles. Chewing, biting, or other movements of the jaw can be difficult or painful. TMJ syndrome can be caused by various things. In many cases, the condition is mild and goes away within a few weeks. For some people, the condition can become a long-term problem. What are the causes? Possible causes of TMJ syndrome include:  Grinding your teeth or clenching your jaw. Some people do this when they are under stress.  Arthritis.  Injury to the jaw.  Head or neck injury.  Teeth or dentures that are not aligned well.  In some cases, the cause of TMJ syndrome may not be known. What are the signs or symptoms? The most common symptom is an aching pain on the side of the head in the area of the TMJ. Other symptoms may include:  Pain when moving your jaw, such as when chewing or biting.  Being unable to open your jaw all the way.  Making a clicking sound when you open your mouth.  Headache.  Earache.  Neck or shoulder pain.  How is this diagnosed? Diagnosis can usually be made based on your symptoms, your medical history, and a physical exam. Your health care provider may check the range of motion of your jaw. Imaging tests, such as X-rays or an MRI, are sometimes done. You may need to see your dentist to determine if your teeth and jaw are lined up correctly. How is this treated? TMJ syndrome often goes away on its own. If treatment is needed, the options may include:  Eating soft foods and applying ice or heat.  Medicines to relieve pain or inflammation.  Medicines to relax the muscles.  A splint, bite plate, or mouthpiece  to prevent teeth grinding or jaw clenching.  Relaxation techniques or counseling to help reduce stress.  Transcutaneous electrical nerve stimulation (TENS). This helps to relieve pain by applying an electrical current through the skin.  Acupuncture. This is sometimes helpful to relieve pain.  Jaw surgery. This is rarely needed.  Follow these instructions at home:  Take medicines only as directed by your health care provider.  Eat a soft diet if you are having trouble chewing.  Apply ice to the painful area. ? Put ice in a plastic bag. ? Place a towel between your skin and the bag. ? Leave the ice on for 20 minutes, 2-3 times a day.  Apply a warm compress to the painful area as directed.  Massage your jaw area and perform any jaw stretching exercises as recommended by your health care provider.  If you were given a mouthpiece or bite plate, wear it as directed.  Avoid foods that require a lot of chewing. Do not chew gum.  Keep all follow-up visits as directed by your health care provider. This is important. Contact a health care provider if:  You are having trouble eating.  You have new or worsening symptoms. Get help right away if:  Your jaw locks open or closed. This information is not intended to replace advice given to you by your health care provider. Make sure you discuss any questions you have with your health care provider. Document   Released: 12/02/2000 Document Revised: 11/07/2015 Document Reviewed: 10/12/2013 Elsevier Interactive Patient Education  2018 Elsevier Inc.  

## 2018-02-07 ENCOUNTER — Emergency Department (INDEPENDENT_AMBULATORY_CARE_PROVIDER_SITE_OTHER): Payer: 59

## 2018-02-07 ENCOUNTER — Encounter: Payer: Self-pay | Admitting: *Deleted

## 2018-02-07 ENCOUNTER — Emergency Department
Admission: EM | Admit: 2018-02-07 | Discharge: 2018-02-07 | Disposition: A | Discharge status: Home / Self Care | Payer: 59 | Source: Home / Self Care | Attending: Family Medicine | Admitting: Family Medicine

## 2018-02-07 ENCOUNTER — Other Ambulatory Visit: Payer: Self-pay

## 2018-02-07 DIAGNOSIS — R079 Chest pain, unspecified: Secondary | ICD-10-CM

## 2018-02-07 DIAGNOSIS — M940 Chondrocostal junction syndrome [Tietze]: Secondary | ICD-10-CM

## 2018-02-07 MED ORDER — PREDNISONE 20 MG PO TABS: ORAL_TABLET | ORAL | 0 refills | Status: DC

## 2018-02-07 NOTE — Discharge Instructions (Signed)
Put ice on the painful area: Put ice in a plastic bag. Place a towel between your skin and the bag. Leave the ice on for 20 minutes, 2-3 times a day. After finishing prednisone, may take Ibuprofen 200mg , 4 tabs every 8 hours with food.

## 2018-02-07 NOTE — ED Triage Notes (Signed)
Pt c/o center of chest pain x 5 days post bronchitis 3 wks ago. Reports some relief with IBF.

## 2018-02-07 NOTE — ED Provider Notes (Signed)
[facility]    CSN: [id number] Arrival date & time: 02/07/18  1627     History   Chief Complaint Chief Complaint  Patient presents with  . Chest Pain    HPI Tracey Yoder is a 27 y.o. female.   Patient had a URI last month that lasted about 10 days before resolving.  About 5 days ago she developed pain in her mid-chest that radiates to her right anterior chest.  The pain is worse with movement and inspiration.  No shortness of breath or fevers, chills, and sweats. She recalls no injury to her chest.  The history is provided by the patient.  Chest Pain  Pain location:  Substernal area Pain quality: radiating and stabbing   Radiates to: right anterior chest. Pain severity:  Moderate Onset quality:  Gradual Duration:  5 days Timing:  Constant Progression:  Unchanged Chronicity:  New Context: breathing, lifting, movement, raising an arm and at rest   Context: not trauma   Relieved by:  None tried Worsened by:  Coughing, deep breathing, certain positions and movement Ineffective treatments: Ibuprofen. Associated symptoms: no abdominal pain, no anorexia, no back pain, no cough, no diaphoresis, no dysphagia, no fatigue, no fever, no heartburn, no lower extremity edema, no nausea, no palpitations and no shortness of breath     Past Medical History:  Diagnosis Date  . Anxiety   . Depression     Patient Active Problem List   Diagnosis Date Noted  . 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 Loss of sensation of skin 03/31/2017  . Episodic lightheadedness 10/27/2016  . Heart murmur 10/27/2016  . Intermittent palpitations 10/27/2016  . Sebaceous cyst 08/04/2016  . History of gluten intolerance 07/21/2016  . IUD (intrauterine device) in place 06/04/2015  . Abnormal liver enzymes 05/22/2015  . Joint pain 04/24/2015  . General counseling and advice for contraceptive management 04/24/2015    Past Surgical History:  Procedure Laterality Date  . NO  PAST SURGERIES      OB History    Gravida  0   Para  0   Term  0   Preterm  0   AB  0   Living  0     SAB  0   TAB  0   Ectopic  0   Multiple  0   Live Births  0            Home Medications    Prior to Admission medications   Medication Sig Start Date End Date Taking? Authorizing Provider  ALPRAZolam Prudy Feeler) 0.5 MG tablet Take 1 tablet (0.5 mg total) by mouth every 8 (eight) hours as needed for anxiety. 07/26/17   Carlis Stable, PA-C  celecoxib (CELEBREX) 200 MG capsule One to 2 tablets by mouth daily as needed for pain. 12/13/17   Monica Becton, MD  CRYSELLE-28 0.3-30 MG-MCG tablet TAKE 1 TABLET BY MOUTH EVERY DAY 08/02/17   Allie Bossier, MD  gabapentin (NEURONTIN) 300 MG capsule Take 1 capsule (300 mg total) by mouth 2 (two) times daily. 05/26/17   York Spaniel, MD  Levonorgestrel (SKYLA) 13.5 MG IUD by Intrauterine route.    [provider]  predniSONE (DELTASONE) 20 MG tablet Take one tab by mouth twice daily for 4 days, then one daily. Take with food. 02/07/18   Lattie Haw, MD    Family History Family History  Problem Relation Age of Onset  . Cancer Mother  skin  . Depression Mother   . Cancer Father        skin  . Diabetes Father   . Depression Father   . Depression Sister   . Depression Brother   . Arthritis Maternal Aunt        rheumatoid  . Heart attack Maternal Grandmother   . Heart attack Maternal Aunt   . Anemia Sister     Social History Social History   Tobacco Use  . Smoking status: Never Smoker  . Smokeless tobacco: Never Used  Substance Use Topics  . Alcohol use: Yes    Alcohol/week: 0.0 standard drinks    Comment: Occ  . Drug use: No     Allergies   Penicillins   Review of Systems Review of Systems  Constitutional: Negative for diaphoresis, fatigue and fever.  HENT: Negative for trouble swallowing.   Respiratory: Positive for chest tightness. Negative for cough, shortness  of breath, wheezing and stridor.   Cardiovascular: Positive for chest pain. Negative for palpitations.  Gastrointestinal: Negative for abdominal pain, anorexia, heartburn and nausea.  Musculoskeletal: Negative for back pain.  All other systems reviewed and are negative.    Physical Exam Triage Vital Signs ED Triage Vitals  Enc Vitals Group     BP 02/07/18 1700 111/72     Pulse Rate 02/07/18 1700 75     Resp 02/07/18 1700 16     Temp 02/07/18 1700 98.4 F (36.9 C)     Temp Source 02/07/18 1700 Oral     SpO2 02/07/18 1700 99 %     Weight 02/07/18 1701 132 lb (59.9 kg)     Height 02/07/18 1701 5' (1.524 m)     Head Circumference --      Peak Flow --      Pain Score 02/07/18 1701 4     Pain Loc --      Pain Edu? --      Excl. in GC? --    No data found.  Updated Vital Signs BP 111/72 (BP Location: Right Arm)   Pulse 75   Temp 98.4 F (36.9 C) (Oral)   Resp 16   Ht 5' (1.524 m)   Wt 59.9 kg   SpO2 99%   BMI 25.78 kg/m   Visual Acuity Right Eye Distance:   Left Eye Distance:   Bilateral Distance:    Right Eye Near:   Left Eye Near:    Bilateral Near:     Physical Exam  Constitutional: She appears well-developed and well-nourished. No distress.  HENT:  Head: Normocephalic.  Nose: Nose normal.  Mouth/Throat: Oropharynx is clear and moist.  Eyes: Pupils are equal, round, and reactive to light. Conjunctivae are normal.  Neck: Neck supple.  Cardiovascular: Normal heart sounds.  Pulmonary/Chest: Breath sounds normal. No stridor. She has no wheezes. She has no rales. She exhibits tenderness and bony tenderness. She exhibits no crepitus and no swelling.  Chest:  Distinct tenderness to palpation over the mid to inferior sternum and xiphoid process as noted on diagram.     Abdominal: Soft. There is no tenderness.  Musculoskeletal: She exhibits no edema or tenderness.  Lymphadenopathy:    She has no cervical adenopathy.  Neurological: She is alert.  Skin: Skin is  warm and dry. No rash noted. She is not diaphoretic.  Nursing note and vitals reviewed.    UC Treatments / Results  Labs (all labs ordered are listed, but only abnormal results are displayed) Labs Reviewed -  No data to display  EKG None  Radiology Dg Chest 2 View  Result Date: 02/07/2018 CLINICAL DATA:  Constant sternal pain for 5 days. EXAM: CHEST - 2 VIEW COMPARISON:  None. FINDINGS: The heart size and mediastinal contours are within normal limits. Both lungs are clear. The manubrium and sternum appear intact without acute appearing fracture or suspicious osseous abnormalities. The visualized skeletal structures are unremarkable. IMPRESSION: No active cardiopulmonary disease. Electronically Signed   By: Tollie Eth M.D.   On: 02/07/2018 17:41    Procedures Procedures (including critical care time)  Medications Ordered in UC Medications - No data to display  Initial Impression / Assessment and Plan / UC Course  I have reviewed the triage vital signs and the nursing notes.  Pertinent labs & imaging results that were available during my care of the patient were reviewed by me and considered in my medical decision making (see chart for details).    Begin prednisone burst/taper. Followup with Dr. Rodney Langton or Dr. Clementeen Graham ([facility]) if not improving about three weeks.   Final Clinical Impressions(s) / UC Diagnoses   Final diagnoses:  Costochondritis     Discharge Instructions      Put ice on the painful area: ? Put ice in a plastic bag. ? Place a towel between your skin and the bag. ? Leave the ice on for 20 minutes, 2-3 times a day. After finishing prednisone, may take Ibuprofen 200mg , 4 tabs every 8 hours with food.      ED Prescriptions    Medication Sig Dispense Auth. Provider   predniSONE (DELTASONE) 20 MG tablet Take one tab by mouth twice daily for 4 days, then one daily. Take with food. 12 tablet Lattie Haw, MD           Lattie Haw, MD 02/07/18 [phone]

## 2018-03-07 ENCOUNTER — Other Ambulatory Visit: Payer: Self-pay | Admitting: Sports Medicine

## 2018-03-07 DIAGNOSIS — M26609 Unspecified temporomandibular joint disorder, unspecified side: Secondary | ICD-10-CM

## 2018-04-11 ENCOUNTER — Encounter: Payer: Self-pay | Admitting: Neurology

## 2018-04-11 ENCOUNTER — Ambulatory Visit: Payer: 59 | Admitting: Neurology

## 2018-04-11 DIAGNOSIS — G588 Other specified mononeuropathies: Secondary | ICD-10-CM

## 2018-04-11 HISTORY — DX: Other specified mononeuropathies: G58.8

## 2018-04-11 MED ORDER — GABAPENTIN 300 MG PO CAPS: 300.0000 mg | ORAL_CAPSULE | Freq: Two times a day (BID) | ORAL | 3 refills | Status: DC

## 2018-04-11 NOTE — Progress Notes (Signed)
Reason for visit: Right genitofemoral neuropathy  Tracey Yoder is an 28 y.o. female  History of present illness:  [name] is a 28 year old right-handed white female with a history of a right genitofemoral neuropathy.  The patient has had ongoing achy sensations that are persistent in the right groin area.  She has not had any change over the last year, the problem started in December 2018.  MRI of the pelvis did not show an etiology for the neuropathy.  The patient indicates that her father does have a history of diabetes, but she does not have diabetes.  The patient has never had hernia surgery.  The patient denies issues voiding the bowels or the bladder, she has no pain with these functions, she does not note any pain that is increased with walking.  She is able to rest fairly well at night.  She returns to this office for an evaluation.  Past Medical History:  Diagnosis Date  . Anxiety   . Depression     Past Surgical History:  Procedure Laterality Date  . NO PAST SURGERIES      Family History  Problem Relation Age of Onset  . Cancer Mother        skin  . Depression Mother   . Cancer Father        skin  . Diabetes Father   . Depression Father   . Depression Sister   . Depression Brother   . Arthritis Maternal Aunt        rheumatoid  . Heart attack Maternal Grandmother   . Heart attack Maternal Aunt   . Anemia Sister     Social history:  reports that she has never smoked. She has never used smokeless tobacco. She reports current alcohol use. She reports that she does not use drugs.    Allergies  Allergen Reactions  . Penicillins Other (See Comments)    Medications:  Prior to Admission medications   Medication Sig Start Date End Date Taking? Authorizing Provider  ALPRAZolam Prudy Feeler(XANAX) 0.5 MG tablet Take 1 tablet (0.5 mg total) by mouth every 8 (eight) hours as needed for anxiety. 07/26/17  Yes Carlis Stableummings, Charley Elizabeth, PA-C  CRYSELLE-28 0.3-30 MG-MCG  tablet TAKE 1 TABLET BY MOUTH EVERY DAY 08/02/17  Yes Dove, Myra C, MD  Levonorgestrel (SKYLA) 13.5 MG IUD by Intrauterine route.   Yes [provider]  gabapentin (NEURONTIN) 300 MG capsule Take 1 capsule (300 mg total) by mouth 2 (two) times daily. Patient not taking: Reported on 04/11/2018 05/26/17   York SpanielWillis, Charles K, MD    ROS:  Out of a complete 14 system review of symptoms, the patient complains only of the following symptoms, and all other reviewed systems are negative.  Groin pain  Blood pressure 128/74, pulse 86, height 5' (1.524 m), weight 135 lb (61.2 kg), SpO2 99 %.  Physical Exam  General: The patient is alert and cooperative at the time of the examination.  Neuromuscular: Range of movement of the low back is full.  Skin: No significant peripheral edema is noted.   Neurologic Exam  Mental status: The patient is alert and oriented x 3 at the time of the examination. The patient has apparent normal recent and remote memory, with an apparently normal attention span and concentration ability.   Cranial nerves: Facial symmetry is present. Speech is normal, no aphasia or dysarthria is noted. Extraocular movements are full. Visual fields are full.  Motor: The patient has good strength in  all 4 extremities.  Sensory examination: Soft touch sensation is symmetric on the face, arms, and legs.  Coordination: The patient has good finger-nose-finger and heel-to-shin bilaterally.  Gait and station: The patient has a normal gait. Tandem gait is normal. Romberg is negative. No drift is seen.  Reflexes: Deep tendon reflexes are symmetric.   Assessment/Plan:  1.  Right genitofemoral neuropathy  The etiology of the neuropathy is not clear.  The patient will have further blood work done today, she will be given a prescription for gabapentin.  She seems to be doing fairly well as long she takes the gabapentin, she will follow-up in 1 year.   Marlan Palau MD 04/11/2018  10:28 AM  [facility] [address] [place], [place] [address]  Phone [phone] Fax [phone]

## 2018-04-13 LAB — RHEUMATOID FACTOR: Rheumatoid fact SerPl-aCnc: <10 IU/mL (ref 0.0–13.9)

## 2018-04-13 LAB — B. BURGDORFI ANTIBODIES: Lyme IgG/IgM Ab: <0.91 {ISR} (ref 0.00–0.90)

## 2018-04-13 LAB — PAN-ANCA
ANCA Proteinase 3: <3.5 U/mL (ref 0.0–3.5)
C-ANCA: <1:20 {titer}
P-ANCA: <1:20 {titer}

## 2018-04-13 LAB — ANA W/REFLEX: Anti Nuclear Antibody(ANA): NEGATIVE

## 2018-04-13 LAB — SEDIMENTATION RATE: Sed Rate: 2 mm/hr (ref 0–32)

## 2018-04-15 ENCOUNTER — Ambulatory Visit (INDEPENDENT_AMBULATORY_CARE_PROVIDER_SITE_OTHER): Payer: 59 | Admitting: Osteopathic Medicine

## 2018-04-15 ENCOUNTER — Encounter: Payer: Self-pay | Admitting: Osteopathic Medicine

## 2018-04-15 VITALS — BP 122/66 | HR 79 | Temp 98.3°F | Wt 134.7 lb

## 2018-04-15 DIAGNOSIS — R102 Pelvic and perineal pain: Secondary | ICD-10-CM

## 2018-04-15 DIAGNOSIS — Z30433 Encounter for removal and reinsertion of intrauterine contraceptive device: Secondary | ICD-10-CM | POA: Diagnosis not present

## 2018-04-15 NOTE — Progress Notes (Signed)
HPI: Tracey Yoder is a 28 y.o. female who presents to [facility] today 04/15/18 for chief complaint of:  IUD removal and insertion of new device   Happy with Christean Grief, had this placed 3 years ago and did well.  Would like to try [facility] today given longer efficacy.  Dr. Marice Potter had placed her on OCP for possible endometriosis after other negative work-up of pelvic pain.  Patient has been taking this medication and doing well but inquires about whether or not to continue.    Past medical, social and family history reviewed: Patient Active Problem List   Diagnosis Date Noted  . Genitofemoral neuralgia of right side 04/11/2018  . 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 Loss of sensation of skin 03/31/2017  . Episodic lightheadedness 10/27/2016  . Heart murmur 10/27/2016  . Intermittent palpitations 10/27/2016  . Sebaceous cyst 08/04/2016  . History of gluten intolerance 07/21/2016  . IUD (intrauterine device) in place 06/04/2015  . Abnormal liver enzymes 05/22/2015  . Joint pain 04/24/2015  . General counseling and advice for contraceptive management 04/24/2015   Past Surgical History:  Procedure Laterality Date  . NO PAST SURGERIES      Family History  Problem Relation Age of Onset  . Cancer Mother        skin  . Depression Mother   . Cancer Father        skin  . Diabetes Father   . Depression Father   . Depression Sister   . Depression Brother   . Arthritis Maternal Aunt        rheumatoid  . Heart attack Maternal Grandmother   . Heart attack Maternal Aunt   . Anemia Sister     Current Outpatient Medications (Endocrine & Metabolic):  .  CRYSELLE-28 0.3-30 MG-MCG tablet, TAKE 1 TABLET BY MOUTH EVERY DAY .  Levonorgestrel (SKYLA) 13.5 MG IUD, by Intrauterine route.      Current Outpatient Medications (Other):  Marland Kitchen  ALPRAZolam (XANAX) 0.5 MG tablet, Take 1 tablet (0.5 mg total) by mouth every 8 (eight) hours as needed  for anxiety. .  gabapentin (NEURONTIN) 300 MG capsule, Take 1 capsule (300 mg total) by mouth 2 (two) times daily. Allergies  Allergen Reactions  . Penicillins Other (See Comments)      Review of Systems: CONSTITUTIONAL:  No  fever, no chills HEAD/EYES/EARS/NOSE/THROAT: No  headache, no vision change, no history migraines CARDIAC: No  chest pain, No  pressure, No palpitations RESPIRATORY: No  cough, No  shortness of breath/wheeze GASTROINTESTINAL: No  nausea, No  vomiting, No  abdominal pain GENITOURINARY: No  incontinence, No  abnormal genital bleeding/discharge SKIN: No  rash/wounds/concerning lesions   Exam:  BP 122/66 (BP Location: Left Arm, Patient Position: Sitting, Cuff Size: Normal)   Pulse 79   Temp 98.3 F (36.8 C) (Oral)   Wt 134 lb 11.2 oz (61.1 kg)   BMI 26.31 kg/m  Constitutional: VS see above. General Appearance: alert, well-developed, well-nourished, NAD Psychiatric: Normal judgment/insight. Normal mood and affect. Oriented x3.  GYN: see procedure note below in A/P        ASSESSMENT/PLAN:   Patient was counseled on the risks versus benefits of IUD contraception, including excellent contraceptive efficacy but risk of uterine perforation, small chance of ascending infection, irregular bleeding, and others. In light of her preferences, previous contraception experience, other risk factors as noted in history of present illness. Patient opts to proceed with insertion of Kyleena IUD. Patient was  educated on the process for insertion, what to expect from vaginal exam and insertion process, reasons that we would abort the procedure such as abnormal anatomy, non-passage of the uterine sound, patient inability to tolerate the procedure, or other. Procedure performed as below.   Advised okay to try coming off of OCP and see how she feels, if pain returns would restart the medication   IUD PROCEDURE NOTE  PERTINENT RESULTS REVIEWED: PREGNANCY TEST PRIOR TO  PROCEDURE: Not Available GONORRHEA/CHLAMYDIA SCREEN: Not Available  PRIOR TO PROCEDURE: INFORMED CONSENT OBTAINED: yes SEE SCANNED DOCUMENTS ANY PRETREATMENT: no  PHYSICAL EXAM: GYN: No lesions/ulcers to external genitalia, normal urethra, normal vaginal mucosa, physiologic discharge, cervix normal without lesions, uterus not enlarged or tender, adnexa no masses and nontender  DESCRIPTION OF PROCEDURE: Vaginal speculum placed. Cervix and proximal vagina cleaned with Betadine.Tenaculum applied at 12:00 cervical position and gentle traction applied. Uterus sounded to 7 cm. IUD placed without difficulty. IUD threads cut to 2-3cm from cervical os. Tenaculum and speculum removed. Patient felt strings. Patient tolerated procedure well. Sterile technique maintained.   IUD INFORMATION: BRAND: Kyleena LOT NUMBER: [id number] CARD GIVEN TO PATIENT: yes   Encounter for IUD removal and reinsertion  Pelvic pain   Patient Instructions  IUD AFTER-CARE INSTRUCTIONS: READ THOROUGHLY  Your Reeves ForthKyleens IUD is currently approved to remain in place for 5 years. At that time, if you wish to receive a new IUD, this can be placed when your current one is removed. If you wish to remove your IUD at any time, for any reason, this can be done easily by your doctor.   You should feel for the strings to your IUD routinely. If you cannot locate the strings, it is recommended you alert your doctor of this.   Be aware that in the first few weeks, your new IUD may cause some discomfort/cramping as it settles into place in your uterus. To ease discomfort, you may apply heating pad to abdomen and take Ibuprofen 800mg  by mouth every 6 hours as needed, but avoid using this dose continuously for more than 5 days. Walking helps as well. You can also expect some irregular bleeding but it should not be heavy for more than a few days.   If pain is severe or if severe bleeding occurs, or if foul-smelling discharge or fever develops,  or if you have any other concerns - contact your doctor right away or go to the Emergency Room.  IUD's are a very reliable method of birth control, but no method is 100% effective. If you think you may be pregnant, see your doctor right away.   An IUD will not protect you from sexually transmitted infections such as HIV, gonorrhea, chlamydia, HPV and others.   It is recommended that you see your doctor as directed for routine well-woman care, which includes Pap testing and may include screening for infections.      All questions were answered. Visit summary with updated medication list and pertinent instructions was printed for patient. ER/RTC precautions were reviewed with the patient. Return for IUD string check IN 4 WEEKS as directed.   Total time spent 25 minutes, greater than 50% of the visit was counseling and coordinating care for diagnosis of The primary encounter diagnosis was Encounter for IUD removal and reinsertion. A diagnosis of Pelvic pain was also pertinent to this visit.

## 2018-04-15 NOTE — Patient Instructions (Signed)
IUD AFTER-CARE INSTRUCTIONS: READ THOROUGHLY  Your Reeves Forth IUD is currently approved to remain in place for 5 years. At that time, if you wish to receive a new IUD, this can be placed when your current one is removed. If you wish to remove your IUD at any time, for any reason, this can be done easily by your doctor.   You should feel for the strings to your IUD routinely. If you cannot locate the strings, it is recommended you alert your doctor of this.   Be aware that in the first few weeks, your new IUD may cause some discomfort/cramping as it settles into place in your uterus. To ease discomfort, you may apply heating pad to abdomen and take Ibuprofen 800mg  by mouth every 6 hours as needed, but avoid using this dose continuously for more than 5 days. Walking helps as well. You can also expect some irregular bleeding but it should not be heavy for more than a few days.   If pain is severe or if severe bleeding occurs, or if foul-smelling discharge or fever develops, or if you have any other concerns - contact your doctor right away or go to the Emergency Room.  IUD's are a very reliable method of birth control, but no method is 100% effective. If you think you may be pregnant, see your doctor right away.   An IUD will not protect you from sexually transmitted infections such as HIV, gonorrhea, chlamydia, HPV and others.   It is recommended that you see your doctor as directed for routine well-woman care, which includes Pap testing and may include screening for infections.

## 2018-04-19 ENCOUNTER — Telehealth: Payer: Self-pay

## 2018-04-19 NOTE — Telephone Encounter (Signed)
Pt left a vm stating she is having severe cramping with bleeding since IUD insertion. Requesting recommendations / feedbacks.

## 2018-04-20 NOTE — Telephone Encounter (Signed)
Should come in for visit. Cramping and bleeding is expected, but should not be worse than a bad period.   If IUD strings are visible, option to remove IUD, or she can give it some more time to see if this settles. We can get an ultrasound to confirm that the IUD is in ok position in the uterus.   If we can't see the strings, will need Xray +/- ultrasound as the IUD may have perforated the uterus.

## 2018-04-20 NOTE — Telephone Encounter (Signed)
Pt has been updated of provider's note and recommendation. She will make an appt to f/u w/provider if needed. As per pt - cramping feels like a bad period, no longer bleeding, but she did state she has slight brownish discharge. Feels that IUD is in place. Will wait to see if symptoms resolve on it's own.

## 2018-05-12 ENCOUNTER — Encounter: Payer: Self-pay | Admitting: Osteopathic Medicine

## 2018-05-12 ENCOUNTER — Ambulatory Visit: Payer: 59 | Admitting: Osteopathic Medicine

## 2018-05-12 DIAGNOSIS — Z975 Presence of (intrauterine) contraceptive device: Secondary | ICD-10-CM

## 2018-05-12 MED ORDER — LEVONORGESTREL 19.5 MG IU IUD: 1.0000 | INTRAUTERINE_SYSTEM | Freq: Once | INTRAUTERINE | 0 refills | Status: DC

## 2018-05-12 NOTE — Progress Notes (Signed)
12/58  

## 2018-05-12 NOTE — Progress Notes (Signed)
CC: IUD string checkup S: Kyleena IUD inserted 04/15/2018, since then no problems O: GYN: No lesions/ulcers to external genitalia, normal urethra, normal vaginal mucosa, physiologic discharge, cervix normal without lesions, IUD strings visible A/P: IUD monitoring. IUD strings appear normal and are not bothering patient/partner, were not trimmed

## 2018-09-03 IMAGING — US US TRANSVAGINAL NON-OB
1 series · 14 of 25 positions shown · non-contrast
Comparison: None

CLINICAL DATA: Dyspareunia and vaginal bleeding for 2 months.  IUD.

EXAM:
TRANSABDOMINAL AND TRANSVAGINAL ULTRASOUND OF PELVIS
TECHNIQUE: Both transabdominal and transvaginal ultrasound examinations of the
pelvis were performed. Transabdominal technique was performed for
global imaging of the pelvis including uterus, ovaries, adnexal
regions, and pelvic cul-de-sac. It was necessary to proceed with
endovaginal exam following the transabdominal exam to visualize the
IUD and ovaries.

[Series 1: us transvaginal non-ob · 0.15mm/px · 14 of 96 slices shown]
[im 1/96]
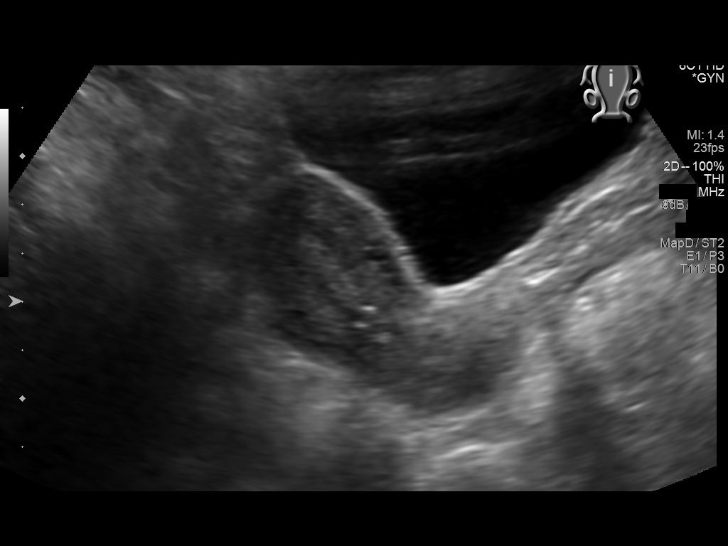
[im 8/96]
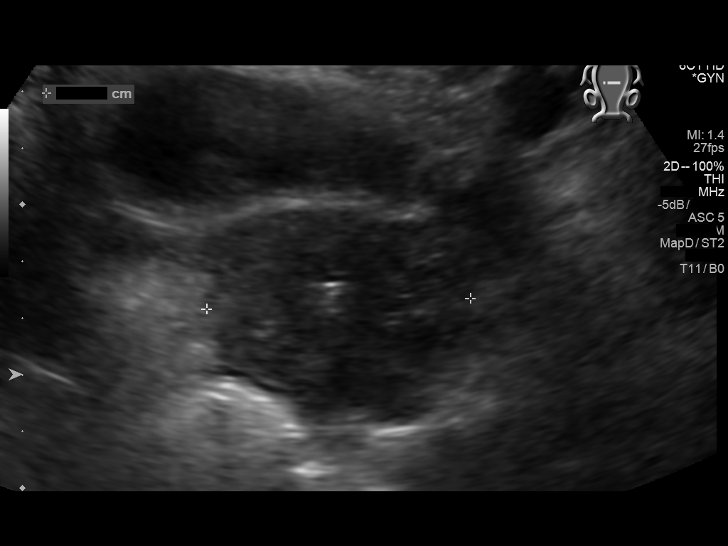
[im 16/96]
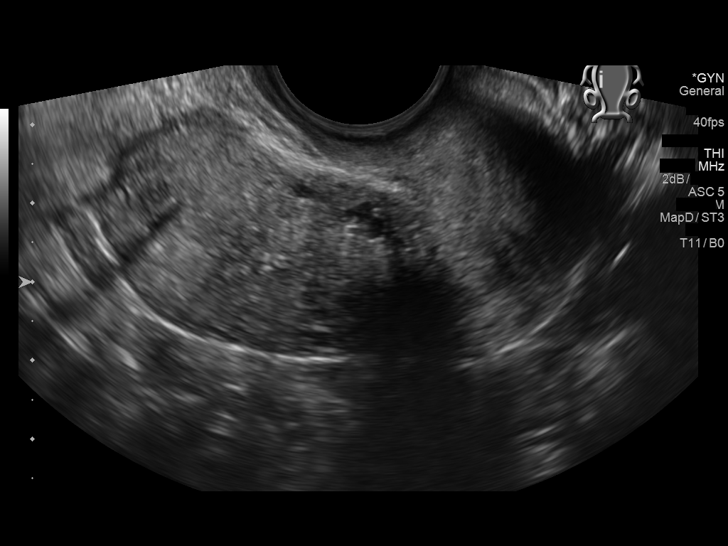
[im 24/96]
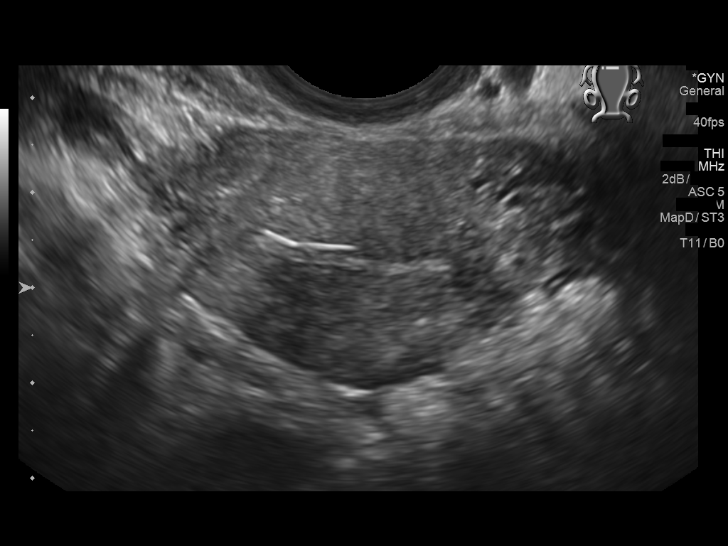
[im 32/96]
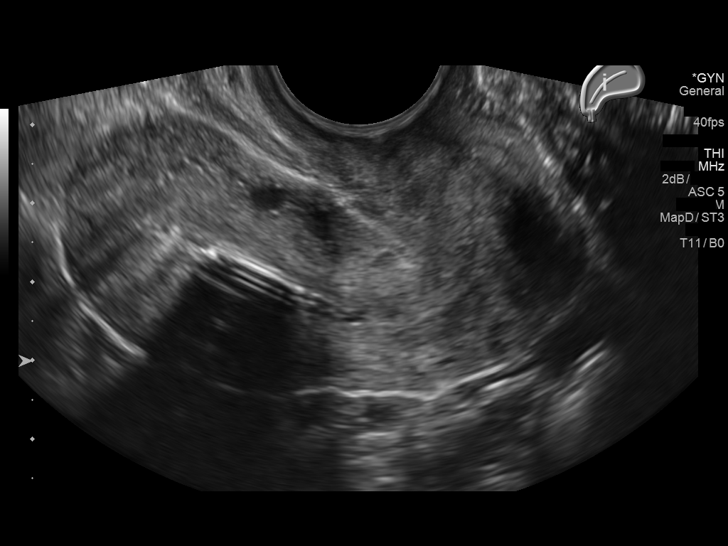
[im 36/96]
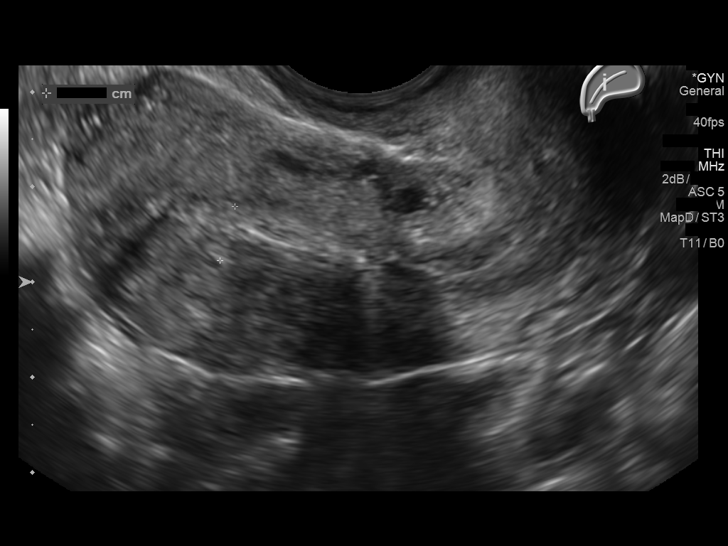
[im 44/96]
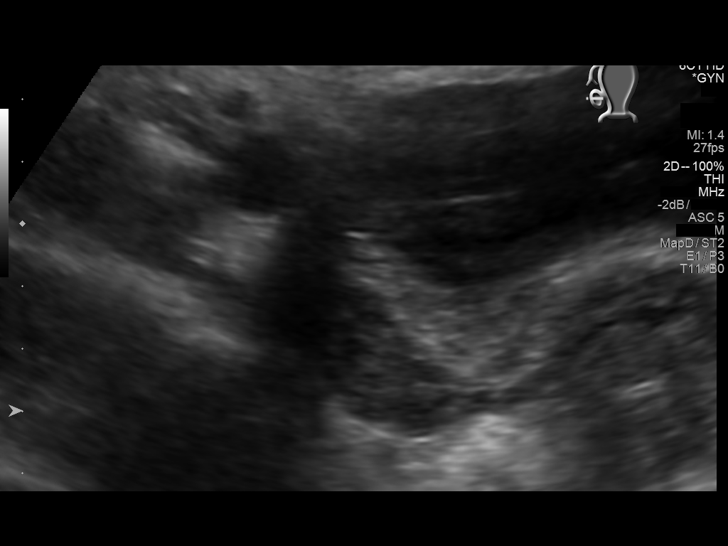
[im 52/96]
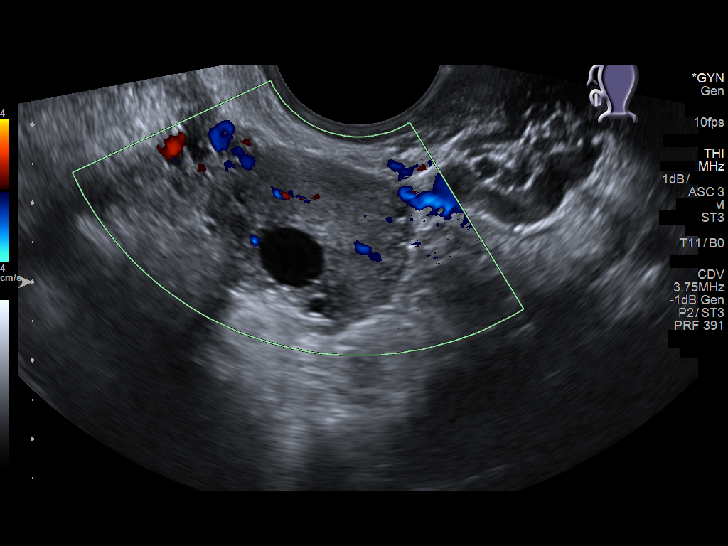
[im 60/96]
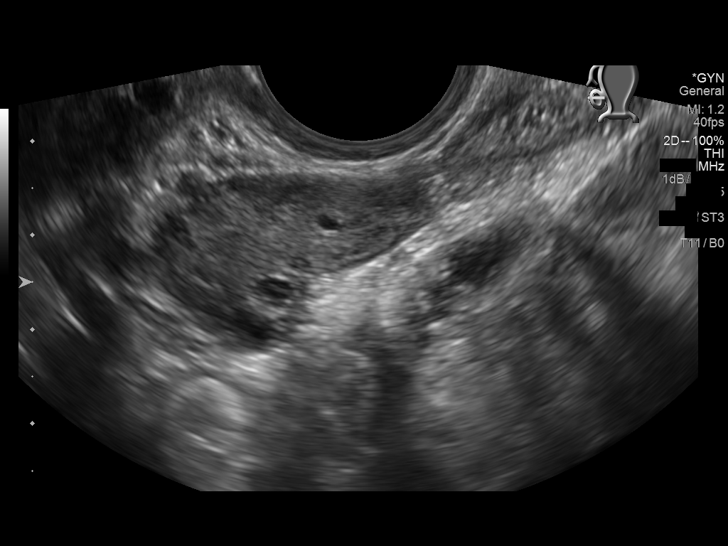
[im 64/96]
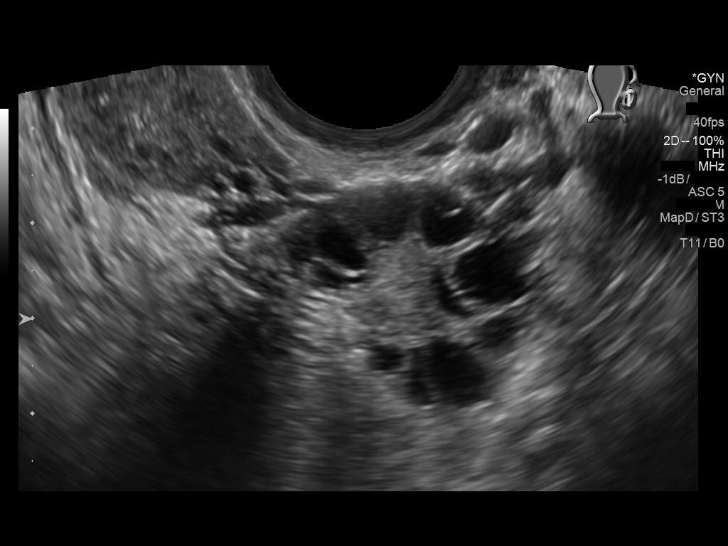
[im 72/96]
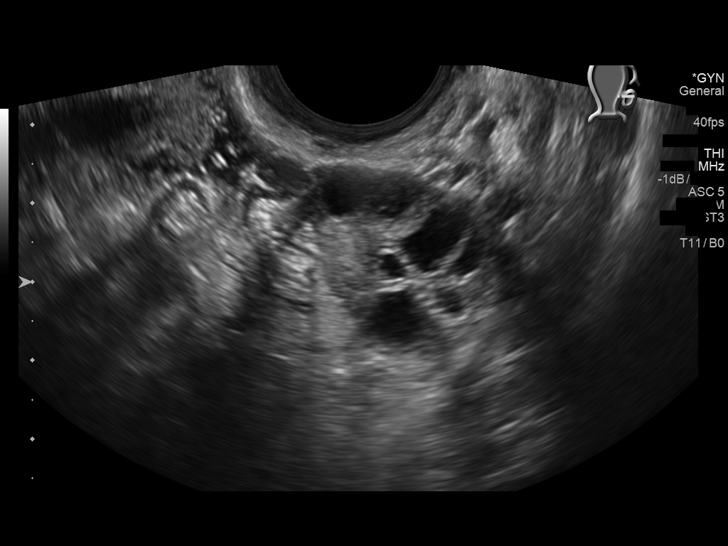
[im 80/96]
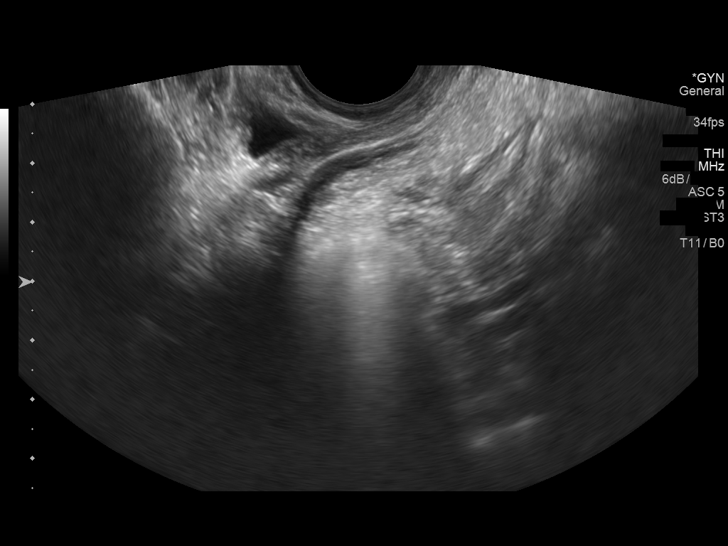
[im 88/96]
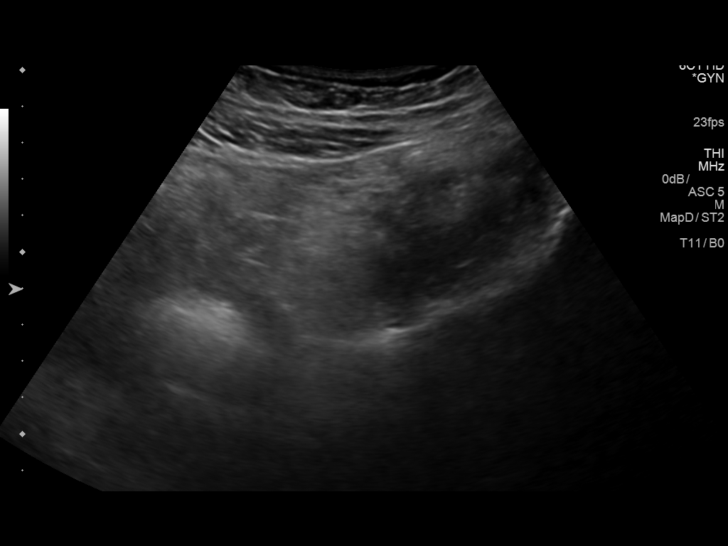
[im 96/96]
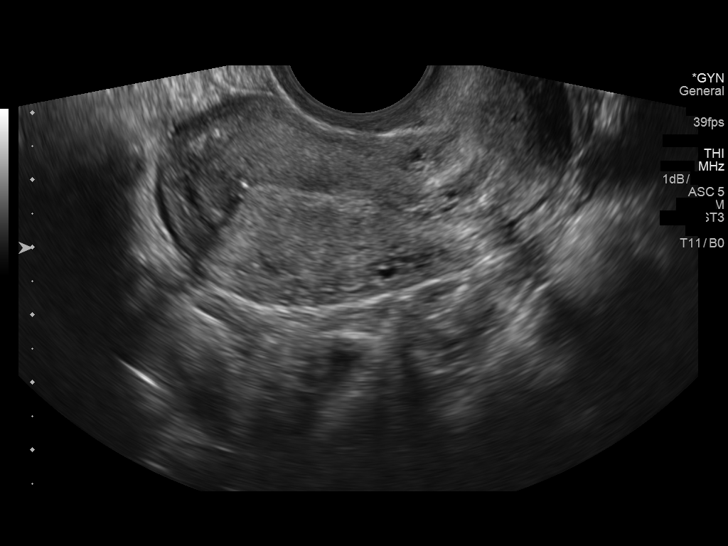

[14 of 25 positions shown; findings below may reference images not displayed]

FINDINGS: Uterus

Measurements: 6.5 x 2.9 x 3.9 cm. No fibroids or other mass
visualized.

Endometrium

Thickness: 6 mm. IUD in expected position within endometrial cavity.

Right ovary

Measurements: 2.5 x 2.0 x 2.7 cm. Normal appearance/no adnexal mass.

Left ovary

Measurements: 2.7 x 1.9 x 2.3 cm. Normal appearance/no adnexal mass.

Other findings

No abnormal free fluid.
IMPRESSION: Normal appearance of uterus. IUD visualized within endometrial
cavity.

Normal appearance of both ovaries. No adnexal abnormality identified
.

## 2018-11-01 ENCOUNTER — Ambulatory Visit (INDEPENDENT_AMBULATORY_CARE_PROVIDER_SITE_OTHER): Payer: 59 | Admitting: Sports Medicine

## 2018-11-01 ENCOUNTER — Other Ambulatory Visit: Payer: Self-pay

## 2018-11-01 ENCOUNTER — Encounter: Payer: Self-pay | Admitting: Sports Medicine

## 2018-11-01 DIAGNOSIS — L7211 Pilar cyst: Secondary | ICD-10-CM | POA: Diagnosis not present

## 2018-11-01 NOTE — Assessment & Plan Note (Signed)
Surgical excision of #2 pilar cysts on the scalp. Primarily closed, ibuprofen for pain, return in 1 week for suture removal.

## 2018-11-01 NOTE — Progress Notes (Signed)
Subjective:    CC: Mass on scalp  HPI: Tracey Yoder is a very pleasant 28 year old female, she has noted 2 new masses on her scalp, they tend to interfere when she brushes her hair, and when she tries to lay in bed at night.  They are minimally tender, localized without radiation, she has noted 2.  I reviewed the past medical history, family history, social history, surgical history, and allergies today and no changes were needed.  Please see the problem list section below in epic for further details.  Past Medical History: Past Medical History:  Diagnosis Date  . Anxiety   . Depression   . Genitofemoral neuralgia of right side 04/11/2018   Past Surgical History: Past Surgical History:  Procedure Laterality Date  . NO PAST SURGERIES     Social History: Social History   Socioeconomic History  . Marital status: Single    Spouse name: Not on file  . Number of children: 0  . Years of education: BA  . Highest education level: Not on file  Occupational History  . Occupation: EMS  Social Needs  . Financial resource strain: Not on file  . Food insecurity    Worry: Not on file    Inability: Not on file  . Transportation needs    Medical: Not on file    Non-medical: Not on file  Tobacco Use  . Smoking status: Never Smoker  . Smokeless tobacco: Never Used  Substance and Sexual Activity  . Alcohol use: Yes    Alcohol/week: 0.0 standard drinks    Comment: Occ  . Drug use: No  . Sexual activity: Yes    Birth control/protection: I.U.D.  Lifestyle  . Physical activity    Days per week: Not on file    Minutes per session: Not on file  . Stress: Not on file  Relationships  . Social Musicianconnections    Talks on phone: Not on file    Gets together: Not on file    Attends religious service: Not on file    Active member of club or organization: Not on file    Attends meetings of clubs or organizations: Not on file    Relationship status: Not on file  Other Topics Concern  . Not on  file  Social History Narrative   Lives alone   Caffeine use: 10-2 cup coffee per day   Right handed    Family History: Family History  Problem Relation Age of Onset  . Cancer Mother        skin  . Depression Mother   . Cancer Father        skin  . Diabetes Father   . Depression Father   . Depression Sister   . Depression Brother   . Arthritis Maternal Aunt        rheumatoid  . Heart attack Maternal Grandmother   . Heart attack Maternal Aunt   . Anemia Sister    Allergies: Allergies  Allergen Reactions  . Penicillins Other (See Comments)   Medications: See med rec.  Review of Systems: No fevers, chills, night sweats, weight loss, chest pain, or shortness of breath.   Objective:    General: Well Developed, well nourished, and in no acute distress.  Neuro: Alert and oriented x3, extra-ocular muscles intact, sensation grossly intact.  HEENT: Normocephalic, atraumatic, pupils equal round reactive to light, neck supple, no masses, no lymphadenopathy, thyroid nonpalpable.  Skin: Warm and dry, no rashes.  There are #2 well-defined palpable  masses in the subcutaneous tissues of the scalp Cardiac: Regular rate and rhythm, no murmurs rubs or gallops, no lower extremity edema.  Respiratory: Clear to auscultation bilaterally. Not using accessory muscles, speaking in full sentences.  Procedure:  Excision of scalp subcutaneous tumor, subcentimeter Risks, benefits, and alternatives explained and consent obtained. Time out conducted. Surface prepped with alcohol. 2cc lidocaine with epinephine infiltrated in a field block. Adequate anesthesia ensured. Area prepped and draped in a sterile fashion. Excision performed with: I made an incision longitudinally with a #10 blade, then using blunt dissection I was able to remove the subcutaneous tumor as a whole, it did appear to be a pilar cyst, I then closed the incision with a single horizontal mattress 3-0 Prolene suture. Hemostasis  achieved. Pt stable.  Procedure:  Excision of scalp subcutaneous tumor, subcentimeter Risks, benefits, and alternatives explained and consent obtained. Time out conducted. Surface prepped with alcohol. 2cc lidocaine with epinephine infiltrated in a field block. Adequate anesthesia ensured. Area prepped and draped in a sterile fashion. Excision performed with: I made an incision longitudinally with a #10 blade, then using blunt dissection I was able to remove the subcutaneous tumor as a whole, it did appear to be a pilar cyst, I then closed the incision with a 2 simple interrupted 3-0 Prolene sutures. Hemostasis achieved. Pt stable.  Impression and Recommendations:    Pilar cysts Surgical excision of #2 pilar cysts on the scalp. Primarily closed, ibuprofen for pain, return in 1 week for suture removal.   ___________________________________________ Gwen Her. Dianah Field, M.D., ABFM., CAQSM. Primary Care and Sports Medicine [facility]  Adjunct Professor of [place] [employer] [facility] [employer]

## 2018-11-09 ENCOUNTER — Ambulatory Visit: Payer: 59 | Admitting: Sports Medicine

## 2018-11-09 ENCOUNTER — Encounter: Payer: Self-pay | Admitting: Sports Medicine

## 2018-11-09 ENCOUNTER — Other Ambulatory Visit: Payer: Self-pay

## 2018-11-09 DIAGNOSIS — L7211 Pilar cyst: Secondary | ICD-10-CM

## 2018-11-09 NOTE — Assessment & Plan Note (Signed)
Doing well status post 2 pilar cyst excision from scalp, sutures removed today, return as needed.

## 2018-11-09 NOTE — Progress Notes (Signed)
  Subjective: 8 days ago I removed Tracey Yoder's pilar cysts, she returns today for suture removal, she is doing extremely well.  Objective: General: Well-developed, well-nourished, and in no acute distress. Scalp: Incisions are clean, dry, intact, all sutures removed.  Assessment/plan:   Pilar cysts Doing well status post 2 pilar cyst excision from scalp, sutures removed today, return as needed.   ___________________________________________ Gwen Her. Dianah Field, M.D., ABFM., CAQSM. Primary Care and Sports Medicine [facility]  Adjunct Professor of [place] [employer] [facility] [employer]

## 2018-11-10 ENCOUNTER — Other Ambulatory Visit: Payer: Self-pay | Admitting: *Deleted

## 2018-11-10 MED ORDER — CRYSELLE-28 0.3-30 MG-MCG PO TABS: 1.0000 | ORAL_TABLET | Freq: Every day | ORAL | 1 refills | Status: DC

## 2018-12-05 ENCOUNTER — Other Ambulatory Visit: Payer: Self-pay

## 2018-12-05 ENCOUNTER — Ambulatory Visit (INDEPENDENT_AMBULATORY_CARE_PROVIDER_SITE_OTHER): Payer: 59 | Admitting: Obstetrics & Gynecology

## 2018-12-05 ENCOUNTER — Encounter: Payer: Self-pay | Admitting: Obstetrics & Gynecology

## 2018-12-05 VITALS — BP 105/62 | HR 76 | Ht 60.0 in | Wt 131.0 lb

## 2018-12-05 DIAGNOSIS — Z124 Encounter for screening for malignant neoplasm of cervix: Secondary | ICD-10-CM | POA: Diagnosis not present

## 2018-12-05 DIAGNOSIS — Z01419 Encounter for gynecological examination (general) (routine) without abnormal findings: Secondary | ICD-10-CM

## 2018-12-05 MED ORDER — CRYSELLE-28 0.3-30 MG-MCG PO TABS: 1.0000 | ORAL_TABLET | Freq: Every day | ORAL | 12 refills | Status: DC

## 2018-12-05 NOTE — Progress Notes (Signed)
Pt is G0 here for annual gyn exam. Pt has IUD, she does not get cycles with her IUD in place. Pt is also taking Cryselle birth control pills. Last pap smear 09/20/2017 normal. Hx of abnormal pap, LGSIL/CIN-1/HPV in 2018.

## 2018-12-05 NOTE — Progress Notes (Signed)
Subjective:    Tracey Yoder is a 28 y.o. single G0  who presents for an annual exam. The patient has no complaints today. She reports that her pelvic pain is best controlled when she uses the IUD Rutha Bouchard now) along with continuous OCPs. She occasionally misses an OCP and the pelvic pain returns immediately. The patient is sexually active. GYN screening history: last pap: was normal. The patient wears seatbelts: yes. The patient participates in regular exercise: yes. Has the patient ever been transfused or tattooed?: no. The patient reports that there is not domestic violence in her life.   Menstrual History: OB History    Gravida  0   Para  0   Term  0   Preterm  0   AB  0   Living  0     SAB  0   TAB  0   Ectopic  0   Multiple  0   Live Births  0           Menarche age: 29 No LMP recorded. (Menstrual status: IUD).    The following portions of the patient's history were reviewed and updated as appropriate: allergies, current medications, past family history, past medical history, past social history, past surgical history and problem list.  Review of Systems Pertinent items are noted in HPI.   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 She gets her flu vaccines at work.   Objective:    BP 105/62   Pulse 76   Ht 5' (1.524 m)   Wt 131 lb (59.4 kg)   BMI 25.58 kg/m   General Appearance:    Alert, cooperative, no distress, appears stated age  Head:    Normocephalic, without obvious abnormality, atraumatic  Eyes:    PERRL, conjunctiva/corneas clear, EOM's intact, fundi    benign, both eyes  Ears:    Normal TM's and external ear canals, both ears  Nose:   Nares normal, septum midline, mucosa normal, no drainage    or sinus tenderness  Throat:   Lips, mucosa, and tongue normal; teeth and gums normal   Neck:   Supple, symmetrical, trachea midline, no adenopathy;    thyroid:  no enlargement/tenderness/nodules; no carotid   bruit or JVD  Back:     Symmetric, no curvature, ROM normal, no CVA tenderness  Lungs:     Clear to auscultation bilaterally, respirations unlabored  Chest Wall:    No tenderness or deformity   Heart:    Regular rate and rhythm, S1 and S2 normal, no murmur, rub   or gallop  Breast Exam:    No tenderness, masses, or nipple abnormality  Abdomen:     Soft, non-tender, bowel sounds active all four quadrants,    no masses, no organomegaly  Genitalia:    Normal female without lesion, discharge or tenderness, normal size and shape, anteverted, mobile, non-tender, normal adnexal exam      Extremities:   Extremities normal, atraumatic, no cyanosis or edema  Pulses:   2+ and symmetric all extremities  Skin:   Skin color, texture, turgor normal, no rashes or lesions  Lymph nodes:   Cervical, supraclavicular, and axillary nodes normal  Neurologic:   CNII-XII intact, normal strength, sensation and reflexes    throughout  .    Assessment:    Healthy female  exam.    Plan:     Thin prep Pap smear.  today then again in 3 years. She may have refills of OCPs for 3 years without a visit.

## 2018-12-07 LAB — CYTOLOGY - PAP: Diagnosis: NEGATIVE

## 2019-02-07 ENCOUNTER — Other Ambulatory Visit: Payer: Self-pay

## 2019-02-07 ENCOUNTER — Encounter: Payer: Self-pay | Admitting: Osteopathic Medicine

## 2019-02-07 ENCOUNTER — Ambulatory Visit (INDEPENDENT_AMBULATORY_CARE_PROVIDER_SITE_OTHER): Payer: 59 | Admitting: Osteopathic Medicine

## 2019-02-07 VITALS — BP 114/69 | HR 96 | Temp 98.3°F | Wt 135.1 lb

## 2019-02-07 DIAGNOSIS — F411 Generalized anxiety disorder: Secondary | ICD-10-CM

## 2019-02-07 MED ORDER — VORTIOXETINE HBR 5 MG PO TABS: 5.0000 mg | ORAL_TABLET | Freq: Every day | ORAL | 0 refills | Status: DC

## 2019-02-07 MED ORDER — ALPRAZOLAM 0.25 MG PO TABS: 0.2500 mg | ORAL_TABLET | Freq: Two times a day (BID) | ORAL | 0 refills | Status: AC | PRN

## 2019-02-07 NOTE — Patient Instructions (Addendum)
For immediate mental health services:  [place], [address] [place], [place] [address], [facility], [address] [place], [place] [address], [phone]  Any emergency room  [facility], (724)686-4203   We are starting a medication today called Trintellix to help treat your anxiety. This is a daily medication to help control your symptoms.   Expect a call or message form this office in the next 2 weeks to check in: If you're doing well on the medicine but not feeling any effect, we can increase the dose. If you're starting to feel some effect/improvement, we can hold off on a dose increase and reevaluate at your office visit.   Try the as-needed medicine, Xanax, up to 2 times per day, try half dose first as this drug can cause sleepiness.   If we are having trouble finding a good medication regimen for you, we can consult with a psychiatrist to assist with medication management.   If you experience problematic side effects, please let me know ASAP - we can switch the medicine any time, and we don't need an appointment for this.   Any questions or concerns, call me!

## 2019-02-07 NOTE — Progress Notes (Signed)
HPI: Tracey Yoder is a 28 y.o. 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  she presents to [facility] today, 02/07/19,  for chief complaint of:  Anxiety    Generalized anxiety disorder is diagnosis per patient, as discussed with her therapist and for our previous conversations.  MDQ today is not showing any concerns though patient's mom does have a history of bipolar disorder, depression dominant.  Patient states that her therapist advised possibly trying buspirone.  Patient has been on Prozac and Zoloft in the past but felt "like a zombie" on these medications, felt emotionally numb.   GAD 7 : Generalized Anxiety Score 02/07/2019 05/12/2018 08/09/2017 07/26/2017  Nervous, Anxious, on Edge 3 2 1 2   Control/stop worrying 3 2 2 3   Worry too much - different things 3 2 2 3   Trouble relaxing 2 1 1 2   Restless 1 1 0 1  Easily annoyed or irritable 2 1 1 1   Afraid - awful might happen 1 1 1 2   Total GAD 7 Score 15 10 8 14   Anxiety Difficulty Somewhat difficult Somewhat difficult - Very difficult  Some encounter information is confidential and restricted. Go to Review Flowsheets activity to see all data.    Depression screen [facility] 2/9 02/07/2019 05/12/2018 08/09/2017  Decreased Interest 1 1 0  Down, Depressed, Hopeless 1 1 0  PHQ - 2 Score 2 2 0  Altered sleeping 1 2 1   Tired, decreased energy 1 2 1   Change in appetite 0 0 1  Feeling bad or failure about yourself  2 2 1   Trouble concentrating 0 0 0  Moving slowly or fidgety/restless 0 0 0  Suicidal thoughts 0 0 0  PHQ-9 Score 6 8 4   Difficult doing work/chores Somewhat difficult Not difficult at all -  Some encounter information is confidential and restricted. Go to Review Flowsheets activity to see all data.      At today's visit 02/07/19 ... PMH, PSH, FH reviewed and updated as needed.  Current medication list and  allergy/intolerance hx reviewed and updated as needed. (See remainder of HPI, ROS, Phys Exam below)   No results found.  No results found for this or any previous visit (from the past 72 hour(s)).        ASSESSMENT/PLAN: The encounter diagnosis was Generalized anxiety disorder.   History of failure of 2 different first-line SSRIs.  Will trial Trintellix, patient advised to activate savings card and medication might require prior authorization.  If we cannot get this covered, probably recommend trial Wellbutrin.  Advised patient that BuSpar is not typically my go to since it is short acting and is not considered first-line therapy for general anxiety disorder no it might not be a bad idea for augmentation depending on how she does with daily Trintellix.  No orders of the defined types were placed in this encounter.    Meds ordered this encounter  Medications  . ALPRAZolam (XANAX) 0.25 MG tablet    Sig: Take 1 tablet (0.25 mg total) by mouth 2 (two) times daily as needed for anxiety.    Dispense:  20 tablet    Refill:  0  . vortioxetine HBr (TRINTELLIX) 5 MG TABS tablet    Sig: Take 1 tablet (5 mg total) by mouth daily.    Dispense:  90 tablet    Refill:  0    Patient will have savings card, p[lease contact  us if PA needed, intolerant to Prozac and Zoloft    Patient Instructions  For immediate mental health services:  [facility], [address], [place], [place] [address], [phone]  [facility], [address], [place], [place] [address], [phone]  Any emergency room  National Suicide Prevention [facility], 253 859 [phone]   We are starting a medication today called Trintellix to help treat your anxiety. This is a daily medication to help control your symptoms.   Expect a call or message form this office in the next 2 weeks to check in: If you're doing well on the medicine but not feeling any effect, we can increase  the dose. If you're starting to feel some effect/improvement, we can hold off on a dose increase and reevaluate at your office visit.   Try the as-needed medicine, Xanax, up to 2 times per day, try half dose first as this drug can cause sleepiness.   If we are having trouble finding a good medication regimen for you, we can consult with a psychiatrist to assist with medication management.   If you experience problematic side effects, please let me know ASAP - we can switch the medicine any time, and we don't need an appointment for this.   Any questions or concerns, call me!       Follow-up plan: Return for RECHECK ON NEW MEDICATION TRINTELLIX OR OTHER - DEPENDING ON PHONE CALL IN 2 WEEKS .                                                 ################################################# ################################################# ################################################# #################################################    Current Meds  Medication Sig  . gabapentin (NEURONTIN) 300 MG capsule Take 1 capsule (300 mg total) by mouth 2 (two) times daily.  . norgestrel-ethinyl estradiol (CRYSELLE-28) 0.3-30 MG-MCG tablet Take 1 tablet by mouth daily.  . [DISCONTINUED] ALPRAZolam (XANAX) 0.5 MG tablet Take 1 tablet (0.5 mg total) by mouth every 8 (eight) hours as needed for anxiety.    Allergies  Allergen Reactions  . Penicillins Other (See Comments)       Review of Systems:  Constitutional: No recent illness  HEENT: No  headache, no vision change  Cardiac: No  chest pain, No  pressure, No palpitations  Respiratory:  No  shortness of breath. No  Cough  Neurologic: No  weakness, No  Dizziness  Psychiatric: No  concerns with depression, +concerns with anxiety  Exam:  BP 114/69 (BP Location: Right Arm, Patient Position: Sitting, Cuff Size: Normal)   Pulse 96   Temp 98.3 F (36.8 C) (Oral)   Wt 135 lb 1.9 oz  (61.3 kg)   BMI 26.39 kg/m   Constitutional: VS see above. General Appearance: alert, well-developed, well-nourished, NAD.  Neck: No masses, trachea midline.   Respiratory: Normal respiratory effort.   Musculoskeletal: Gait normal. Symmetric and independen  Neurological: Normal balance/coordination. No tremor.  Skin: warm, dry, intact.   Psychiatric: Normal judgment/insight. Normal mood and affect. Oriented x3.       Visit summary with medication list and pertinent instructions was printed for patient to review, patient was advised to alert us if any updates are needed. All questions at time of visit were answered - patient instructed to contact office with any additional concerns. ER/RTC precautions were reviewed with the patient and understanding verbalized.   Note:  Total time spent 25 minutes, greater than 50% of the visit was spent face-to-face counseling and coordinating care for the following: The encounter diagnosis was Generalized anxiety disorder..  Please note: voice recognition software was used to produce this document, and typos may escape review. Please contact Dr. Lyn Hollingshead for any needed clarifications.    Follow up plan: Return for RECHECK ON NEW MEDICATION TRINTELLIX OR OTHER - DEPENDING ON PHONE CALL IN 2 WEEKS .

## 2019-02-13 ENCOUNTER — Telehealth: Payer: Self-pay

## 2019-02-13 NOTE — Telephone Encounter (Signed)
Pt left a vm msg stating she started taking Trintellix on 02/10/19. She has been having nosebleeds everyday since starting rx. She is unsure whether it is a side effect from the medication. Requesting feed back from provider. Pls advise, thanks.

## 2019-02-13 NOTE — Telephone Encounter (Signed)
Nosebleeds are not listed as a known side effect of the medication.  Anything is possible, but it may just be a coincidence.  If she needs to come in for evaluation of nosebleeds, let me know.  If she wanted to try stopping the medication for a week or two and then restarting it to see if the same thing happens, that would also be reasonable

## 2019-02-14 NOTE — Telephone Encounter (Signed)
Pt has been updated regarding provider's note. Agreeable with plan. Pt will stop taking medication 1-2 weeks to see if nosebleeds stops happening. Pt will make an appt prn if symptoms continue or worsen. No other inquiries during call.

## 2019-04-04 ENCOUNTER — Telehealth (INDEPENDENT_AMBULATORY_CARE_PROVIDER_SITE_OTHER): Payer: 59 | Admitting: Osteopathic Medicine

## 2019-04-04 ENCOUNTER — Encounter: Payer: Self-pay | Admitting: Osteopathic Medicine

## 2019-04-04 ENCOUNTER — Telehealth: Payer: Self-pay | Admitting: Osteopathic Medicine

## 2019-04-04 VITALS — Wt 135.0 lb

## 2019-04-04 DIAGNOSIS — R5382 Chronic fatigue, unspecified: Secondary | ICD-10-CM

## 2019-04-04 DIAGNOSIS — R42 Dizziness and giddiness: Secondary | ICD-10-CM | POA: Diagnosis not present

## 2019-04-04 DIAGNOSIS — F419 Anxiety disorder, unspecified: Secondary | ICD-10-CM | POA: Diagnosis not present

## 2019-04-04 DIAGNOSIS — F329 Major depressive disorder, single episode, unspecified: Secondary | ICD-10-CM

## 2019-04-04 DIAGNOSIS — F32A Depression, unspecified: Secondary | ICD-10-CM

## 2019-04-04 DIAGNOSIS — Z832 Family history of diseases of the blood and blood-forming organs and certain disorders involving the immune mechanism: Secondary | ICD-10-CM

## 2019-04-04 MED ORDER — VORTIOXETINE HBR 10 MG PO TABS: 10.0000 mg | ORAL_TABLET | Freq: Every day | ORAL | 0 refills | Status: DC

## 2019-04-04 MED ORDER — CRYSELLE-28 0.3-30 MG-MCG PO TABS: 1.0000 | ORAL_TABLET | Freq: Every day | ORAL | 12 refills | Status: DC

## 2019-04-04 NOTE — Telephone Encounter (Signed)
Appointment has been made. No further questions at this time.  

## 2019-04-04 NOTE — Telephone Encounter (Signed)
-----   Message from Sunnie Nielsen, DO sent at 04/04/2019  3:25 PM EST -----  Follow Up Instructions: Return in about 4 weeks (around 05/02/2019) for RECHECK ON NEW MEDICATION DOSE - TRINTELLIX. OTHERWISE PENDING LABS! (VIRTUAL) .

## 2019-04-04 NOTE — Patient Instructions (Signed)
Will get labs 

## 2019-04-04 NOTE — Progress Notes (Signed)
Virtual Visit via Video (App used: doximity) Note  I connected with      Tracey Yoder on 04/04/19 at 1:09 PM  by a telemedicine application and verified that I am speaking with the correct person using two identifiers.  Patient is at home I am in office   I discussed the limitations of evaluation and management by telemedicine and the availability of in person appointments. The patient expressed understanding and agreed to proceed.  History of Present Illness: Tracey Yoder is a 29 y.o. female who would like to discuss mental health, concern for anemia  Mental health: Recently started back on the Trintellix, was off this briefly due to concern for potential adverse event of epistaxis.  This did not recur with resuming medication.  Mood/depression/anxiety was noted to improve for a couple of weeks but then went back down again more or less.  Noticing more fatigue and episodic lightheadedness has been stable.  Sister has a history of pernicious anemia.  Patient would like some lab testing to see if she might also have this condition.  She does not have menstrual cycles on current IUD, she is also taking birth control pills for endometriosis       Observations/Objective: Wt 135 lb (61.2 kg)   BMI 26.37 kg/m  BP Readings from Last 3 Encounters:  02/07/19 114/69  12/05/18 105/62  11/09/18 102/60   Exam: Normal Speech.  NAD  Lab and Radiology Results No results found for this or any previous visit (from the past 72 hour(s)). No results found.     Assessment and Plan: 29 y.o. female with The primary encounter diagnosis was Anxiety and depression. Diagnoses of Chronic fatigue, Family history of pernicious anemia, and Episodic lightheadedness were also pertinent to this visit.  Let us try increasing Trintellix from 5 mg to 10 mg.  Labs as below to work-up for fatigue/anemia/B12 deficiency   PDMP not reviewed this encounter. Orders Placed This Encounter   Procedures  . CBC  . COMPLETE METABOLIC PANEL WITH GFR  . LIPID SCREENING  . TSH  . T4, FREE  . Vitamin B12   Meds ordered this encounter  Medications  . vortioxetine HBr (TRINTELLIX) 10 MG TABS tablet    Sig: Take 1 tablet (10 mg total) by mouth daily.    Dispense:  90 tablet    Refill:  0    Patient will have savings card, p[lease contact us if PA needed, intolerant to Prozac and Zoloft  . norgestrel-ethinyl estradiol (CRYSELLE-28) 0.3-30 MG-MCG tablet    Sig: Take 1 tablet by mouth daily. In addition to IUD, ~endometriosis    Dispense:  28 tablet    Refill:  12     Follow Up Instructions: Return in about 4 weeks (around 05/02/2019) for RECHECK [facility]. OTHERWISE PENDING LABS! (VIRTUAL) .    I discussed the assessment and treatment plan with the patient. The patient was provided an opportunity to ask questions and all were answered. The patient agreed with the plan and demonstrated an understanding of the instructions.   The patient was advised to call back or seek an in-person evaluation if any new concerns, if symptoms worsen or if the condition fails to improve as anticipated.  30 minutes of non-face-to-face time was provided during this encounter.      . . . . . . . . . . . . . Marland Kitchen  Historical information moved to improve visibility of documentation.  Past Medical History:  Diagnosis Date  . Anxiety   . Depression   . Genitofemoral neuralgia of right side 04/11/2018   Past Surgical History:  Procedure Laterality Date  . NO PAST SURGERIES     Social History   Tobacco Use  . Smoking status: Never Smoker  . Smokeless tobacco: Never Used  Substance Use Topics  . Alcohol use: Yes    Alcohol/week: 0.0 standard drinks    Comment: Occ   family history includes Anemia in her sister; Arthritis in her maternal aunt; Cancer in her father and mother; Depression in her brother, father, mother,  and sister; Diabetes in her father; Heart attack in her father, maternal aunt, and maternal grandmother.  Medications: Current Outpatient Medications  Medication Sig Dispense Refill  . ALPRAZolam (XANAX) 0.25 MG tablet Take 1 tablet (0.25 mg total) by mouth 2 (two) times daily as needed for anxiety. 20 tablet 0  . gabapentin (NEURONTIN) 300 MG capsule Take 1 capsule (300 mg total) by mouth 2 (two) times daily. 180 capsule 3  . Levonorgestrel (KYLEENA) 19.5 MG IUD 1 each (19.5 mg total) by Intrauterine route once for 1 dose. Inserted 04/15/2018 1 each 0  . norgestrel-ethinyl estradiol (CRYSELLE-28) 0.3-30 MG-MCG tablet Take 1 tablet by mouth daily. In addition to IUD, ~endometriosis 28 tablet 12  . vortioxetine HBr (TRINTELLIX) 10 MG TABS tablet Take 1 tablet (10 mg total) by mouth daily. 90 tablet 0   No current facility-administered medications for this visit.   Allergies  Allergen Reactions  . Penicillins Other (See Comments)

## 2019-04-08 LAB — COMPLETE METABOLIC PANEL WITH GFR
AG Ratio: 1.6 (calc) (ref 1.0–2.5)
ALT: 15 U/L (ref 6–29)
AST: 14 U/L (ref 10–30)
Albumin: 4.1 g/dL (ref 3.6–5.1)
Alkaline phosphatase (APISO): 34 U/L (ref 31–125)
BUN: 11 mg/dL (ref 7–25)
CO2: 27 mmol/L (ref 20–32)
Calcium: 8.9 mg/dL (ref 8.6–10.2)
Chloride: 105 mmol/L (ref 98–110)
Creat: 0.68 mg/dL (ref 0.50–1.10)
GFR, Est African American: 140 mL/min/{1.73_m2} (ref >=60)
GFR, Est Non African American: 121 mL/min/{1.73_m2} (ref >=60)
Globulin: 2.6 g/dL (calc) (ref 1.9–3.7)
Glucose, Bld: 85 mg/dL (ref 65–99)
Potassium: 3.9 mmol/L (ref 3.5–5.3)
Sodium: 138 mmol/L (ref 135–146)
Total Bilirubin: 0.7 mg/dL (ref 0.2–1.2)
Total Protein: 6.7 g/dL (ref 6.1–8.1)

## 2019-04-08 LAB — CBC
HCT: 38.5 % (ref 35.0–45.0)
Hemoglobin: 13.1 g/dL (ref 11.7–15.5)
MCH: 30.7 pg (ref 27.0–33.0)
MCHC: 34 g/dL (ref 32.0–36.0)
MCV: 90.2 fL (ref 80.0–100.0)
MPV: 10.3 fL (ref 7.5–12.5)
Platelets: 259 10*3/uL (ref 140–400)
RBC: 4.27 10*6/uL (ref 3.80–5.10)
RDW: 12 % (ref 11.0–15.0)
WBC: 4.7 10*3/uL (ref 3.8–10.8)

## 2019-04-08 LAB — LIPID PANEL
Cholesterol: 181 mg/dL (ref <200)
HDL: 52 mg/dL (ref >=50)
LDL Cholesterol (Calc): 113 mg/dL (calc) — ABNORMAL HIGH
Non-HDL Cholesterol (Calc): 129 mg/dL (calc) (ref <130)
Total CHOL/HDL Ratio: 3.5 (calc) (ref <5.0)
Triglycerides: 73 mg/dL (ref <150)

## 2019-04-08 LAB — T4, FREE: Free T4: 1.1 ng/dL (ref 0.8–1.8)

## 2019-04-08 LAB — TSH: TSH: 2.12 mIU/L

## 2019-04-08 LAB — VITAMIN B12: Vitamin B-12: 378 pg/mL (ref 200–1100)

## 2019-04-12 NOTE — Progress Notes (Signed)
PATIENT: Tracey Yoder DOB: [birth year]  REASON FOR VISIT: follow up HISTORY FROM: patient  HISTORY OF PRESENT ILLNESS: Today 04/13/19  Tracey Yoder is a 29 year old female with history of right genitofemoral neuropathy.  She has aching sensations to the persistent right groin area.  MRI of the pelvis did not show any etiology for the neuropathy.  She does not have history of diabetes.  Laboratory evaluation (ANA, rheumatoid factor, b. burgdorfi, sed rate, Pan-ANCA) were unremarkable.  She has recently been started on Trintellix.  She indicates the sensation is stable.  She has noticed the area has expanded may be about 2 inches down her upper thigh.  She describes a sensation of numbness, at times is achy. Sensation of clothing touching is not uncomfortable.  She is being treated with 2 types of birth control for endometriosis.  Gabapentin works well for the sensations.  She works as a Radiation protection practitioner.Her B12 level was recently normal 378.  She presents today for evaluation unaccompanied.  HISTORY 04/11/2018 Dr. Anne Hahn: Tracey Yoder is a 29 year old right-handed white female with a history of a right genitofemoral neuropathy.  The patient has had ongoing achy sensations that are persistent in the right groin area.  She has not had any change over the last year, the problem started in December 2018.  MRI of the pelvis did not show an etiology for the neuropathy.  The patient indicates that her father does have a history of diabetes, but she does not have diabetes.  The patient has never had hernia surgery.  The patient denies issues voiding the bowels or the bladder, she has no pain with these functions, she does not note any pain that is increased with walking.  She is able to rest fairly well at night.  She returns to this office for an evaluation.  REVIEW OF SYSTEMS: Out of a complete 14 system review of symptoms, the patient complains only of the following symptoms, and all other reviewed systems  are negative.  Numbness  ALLERGIES: Allergies  Allergen Reactions  . Penicillins Other (See Comments)    HOME MEDICATIONS: Outpatient Medications Prior to Visit  Medication Sig Dispense Refill  . ALPRAZolam (XANAX) 0.25 MG tablet Take 1 tablet (0.25 mg total) by mouth 2 (two) times daily as needed for anxiety. 20 tablet 0  . Levonorgestrel (KYLEENA) 19.5 MG IUD by Intrauterine route.    . norgestrel-ethinyl estradiol (CRYSELLE-28) 0.3-30 MG-MCG tablet Take 1 tablet by mouth daily. In addition to IUD, ~endometriosis 28 tablet 12  . vortioxetine HBr (TRINTELLIX) 10 MG TABS tablet Take 1 tablet (10 mg total) by mouth daily. 90 tablet 0  . gabapentin (NEURONTIN) 300 MG capsule Take 1 capsule (300 mg total) by mouth 2 (two) times daily. 180 capsule 3  . Levonorgestrel (KYLEENA) 19.5 MG IUD 1 each (19.5 mg total) by Intrauterine route once for 1 dose. Inserted 04/15/2018 1 each 0   No facility-administered medications prior to visit.    PAST MEDICAL HISTORY: Past Medical History:  Diagnosis Date  . Anxiety   . Depression   . Genitofemoral neuralgia of right side 04/11/2018    PAST SURGICAL HISTORY: Past Surgical History:  Procedure Laterality Date  . NO PAST SURGERIES      FAMILY HISTORY: Family History  Problem Relation Age of Onset  . Cancer Mother        skin  . Depression Mother   . Cancer Father        skin  . Diabetes Father   .  Depression Father   . Heart attack Father   . Depression Sister   . Depression Brother   . Arthritis Maternal Aunt        rheumatoid  . Heart attack Maternal Grandmother   . Heart attack Maternal Aunt   . Anemia Sister     SOCIAL HISTORY: Social History   Socioeconomic History  . Marital status: Single    Spouse name: Not on file  . Number of children: 0  . Years of education: BA  . Highest education level: Not on file  Occupational History  . Occupation: EMS  Tobacco Use  . Smoking status: Never Smoker  . Smokeless  tobacco: Never Used  Substance and Sexual Activity  . Alcohol use: Yes    Alcohol/week: 0.0 standard drinks    Comment: Occ  . Drug use: No  . Sexual activity: Yes    Birth control/protection: I.U.D.  Other Topics Concern  . Not on file  Social History Narrative   Lives alone   Caffeine use: 10-2 cup coffee per day   Right handed    Social Determinants of Health   Financial Resource Strain:   . Difficulty of Paying Living Expenses: Not on file  Food Insecurity:   . Worried About Charity fundraiser in the Last Year: Not on file  . Ran Out of Food in the Last Year: Not on file  Transportation Needs:   . Lack of Transportation (Medical): Not on file  . Lack of Transportation (Non-Medical): Not on file  Physical Activity:   . Days of Exercise per Week: Not on file  . Minutes of Exercise per Session: Not on file  Stress:   . Feeling of Stress : Not on file  Social Connections:   . Frequency of Communication with Friends and Family: Not on file  . Frequency of Social Gatherings with Friends and Family: Not on file  . Attends Religious Services: Not on file  . Active Member of Clubs or Organizations: Not on file  . Attends Archivist Meetings: Not on file  . Marital Status: Not on file  Intimate Partner Violence:   . Fear of Current or Ex-Partner: Not on file  . Emotionally Abused: Not on file  . Physically Abused: Not on file  . Sexually Abused: Not on file   PHYSICAL EXAM  Vitals:   04/13/19 0951  BP: 120/73  Pulse: 80  Temp: 98 F (36.7 C)  Weight: 136 lb 9.6 oz (62 kg)  Height: 5' (1.524 m)   Body mass index is 26.68 kg/m.  Generalized: Well developed, in no acute distress   Neurological examination  Mentation: Alert oriented to time, place, history taking. Follows all commands speech and language fluent Cranial nerve II-XII: Pupils were equal round reactive to light. Extraocular movements were full, visual field were full on confrontational test.  Facial sensation and strength were normal.  Head turning and shoulder shrug  were normal and symmetric. Motor: The motor testing reveals 5 over 5 strength of all 4 extremities. Good symmetric motor tone is noted throughout.  Sensory: Sensory testing is intact to soft touch on all 4 extremities. No evidence of extinction is noted.  Coordination: Cerebellar testing reveals good finger-nose-finger and heel-to-shin bilaterally.  Gait and station: Gait is normal. Tandem gait is normal. Romberg is negative. No drift is seen.  Reflexes: Deep tendon reflexes are symmetric and normal bilaterally.   DIAGNOSTIC DATA (LABS, IMAGING, TESTING) - I reviewed patient records, labs,  notes, testing and imaging myself where available.  Lab Results  Component Value Date   WBC 4.7 04/07/2019   HGB 13.1 04/07/2019   HCT 38.5 04/07/2019   MCV 90.2 04/07/2019   PLT 259 04/07/2019      Component Value Date/Time   NA 138 04/07/2019 1514   K 3.9 04/07/2019 1514   CL 105 04/07/2019 1514   CO2 27 04/07/2019 1514   GLUCOSE 85 04/07/2019 1514   BUN 11 04/07/2019 1514   CREATININE 0.68 04/07/2019 1514   CALCIUM 8.9 04/07/2019 1514   PROT 6.7 04/07/2019 1514   ALBUMIN 4.2 10/27/2016 1529   AST 14 04/07/2019 1514   ALT 15 04/07/2019 1514   ALKPHOS 42 10/27/2016 1529   BILITOT 0.7 04/07/2019 1514   GFRNONAA 121 04/07/2019 1514   GFRAA 140 04/07/2019 1514   Lab Results  Component Value Date   CHOL 181 04/07/2019   HDL 52 04/07/2019   LDLCALC 113 (H) 04/07/2019   TRIG 73 04/07/2019   CHOLHDL 3.5 04/07/2019   Lab Results  Component Value Date   HGBA1C 4.8 03/18/2016   Lab Results  Component Value Date   VITAMINB12 378 04/07/2019   Lab Results  Component Value Date   TSH 2.12 04/07/2019   ASSESSMENT AND PLAN 29 y.o. year old female  has a past medical history of Anxiety, Depression, and Genitofemoral neuralgia of right side (04/11/2018). here with:  1. Genitofemoral neuralgia of the right side    She has been treated for possible endometriosis, with 2 types of birth control. She says her symptoms of endometriosis have improved.  The sensation of the right genitofemoral neuralgia, numbness has remained, has slightly expanded about 2 inches down her upper thigh.  Gabapentin works well to control the achy sensation.  She will remain on gabapentin.  MRI of the pelvis has been normal. Dr. Anne Hahn has indicated prior, rarely, endometriosis can cause genitofemoral neuropathy.  She will follow-up in 1 year or sooner if needed.  She has close follow-up with her primary doctor.   I spent 15 minutes with the patient. 50% of this time was spent discussing her plan of care.    Margie Ege, AGNP-C, DNP 04/13/2019, 10:35 AM [facility] [address], [address] [place], [place] [address] [phone]

## 2019-04-13 ENCOUNTER — Ambulatory Visit: Payer: 59 | Admitting: Neurology

## 2019-04-13 ENCOUNTER — Other Ambulatory Visit: Payer: Self-pay

## 2019-04-13 ENCOUNTER — Encounter: Payer: Self-pay | Admitting: Neurology

## 2019-04-13 DIAGNOSIS — G588 Other specified mononeuropathies: Secondary | ICD-10-CM

## 2019-04-13 MED ORDER — GABAPENTIN 300 MG PO CAPS: 300.0000 mg | ORAL_CAPSULE | Freq: Two times a day (BID) | ORAL | 3 refills | Status: DC

## 2019-04-13 NOTE — Patient Instructions (Signed)
Remain on gabapentin as prescribed. I will refill the medications. We will check in about a year

## 2019-04-13 NOTE — Progress Notes (Signed)
I have read the note, and I agree with the clinical assessment and plan.  Sumaiya Arruda K Theophil Thivierge   

## 2019-05-02 ENCOUNTER — Encounter: Payer: Self-pay | Admitting: Osteopathic Medicine

## 2019-05-02 ENCOUNTER — Ambulatory Visit (INDEPENDENT_AMBULATORY_CARE_PROVIDER_SITE_OTHER): Payer: 59 | Admitting: Osteopathic Medicine

## 2019-05-02 VITALS — BP 152/80 | HR 125 | Temp 98.1°F | Ht 60.0 in | Wt 137.0 lb

## 2019-05-02 DIAGNOSIS — G90A Postural orthostatic tachycardia syndrome (POTS): Secondary | ICD-10-CM

## 2019-05-02 DIAGNOSIS — I498 Other specified cardiac arrhythmias: Secondary | ICD-10-CM | POA: Diagnosis not present

## 2019-05-02 MED ORDER — VORTIOXETINE HBR 10 MG PO TABS: 10.0000 mg | ORAL_TABLET | Freq: Every day | ORAL | 3 refills | Status: DC

## 2019-05-02 MED ORDER — VORTIOXETINE HBR 10 MG PO TABS: 10.0000 mg | ORAL_TABLET | Freq: Every day | ORAL | 0 refills | Status: DC

## 2019-05-02 NOTE — Progress Notes (Signed)
Tracey Yoder is a 29 y.o. female who presents to  [facility]  today, 05/02/19, seeking care for the following:  The encounter diagnosis was POTS (postural orthostatic tachycardia syndrome).   ASSESSMENT & PLAN with other pertinent history/findings:  1. POTS (postural orthostatic tachycardia syndrome) See nurse notes for orthostatic VC, BP up and HR also up! Pt notes that on standing, the sensation is always there to some extent w/ severity ranging from mild slightly swimmy-headed to severe feeling significantly faint and having tunnel vision. We completed Holter and Echo in 10/2016, no concerning findings. EKG 10/2016 no concerns other than mild sinus arrhythmia. No LOC. No falls, No CP. Has been going on for years, worse over past few months, doesn't feel it can be attributed to the trintellix and would like to stay on this Ix as it's helping mental health.     Orders Placed This Encounter  Procedures  . Ambulatory referral to Cardiology    Meds ordered this encounter  Medications  . DISCONTD: vortioxetine HBr (TRINTELLIX) 10 MG TABS tablet    Sig: Take 1 tablet (10 mg total) by mouth daily.    Dispense:  90 tablet    Refill:  3    Patient will have savings card, p[lease contact us if PA needed, intolerant to Prozac and Zoloft  . vortioxetine HBr (TRINTELLIX) 10 MG TABS tablet    Sig: Take 1 tablet (10 mg total) by mouth daily.    Dispense:  90 tablet    Refill:  0    Patient will have savings card, p[lease contact us if PA needed, intolerant to Prozac and Zoloft       Follow-up instructions: Return for recheck depdning on cardiology recommendations, otherwise annual in 6-12 months .                   BP (!) 152/80 (BP Location: Left Arm, Cuff Size: Normal)   Pulse (!) 125   Temp 98.1 F (36.7 C) (Oral)   Ht 5' (1.524 m)   Wt 137 lb (62.1 kg)   BMI 26.76 kg/m   No outpatient medications  have been marked as taking for the 05/02/19 encounter (Office Visit) with Sunnie Nielsen, DO.    No results found for this or any previous visit (from the past 72 hour(s)).  No results found.  Depression screen [facility] 2/9 04/04/2019 02/07/2019 05/12/2018  Decreased Interest 2 1 1   Down, Depressed, Hopeless 2 1 1   PHQ - 2 Score 4 2 2   Altered sleeping 3 1 2   Tired, decreased energy 3 1 2   Change in appetite 1 0 0  Feeling bad or failure about yourself  1 2 2   Trouble concentrating 2 0 0  Moving slowly or fidgety/restless 1 0 0  Suicidal thoughts 0 0 0  PHQ-9 Score 15 6 8   Difficult doing work/chores Somewhat difficult Somewhat difficult Not difficult at all  Some encounter information is confidential and restricted. Go to Review Flowsheets activity to see all data.    GAD 7 : Generalized Anxiety Score 04/04/2019 02/07/2019 05/12/2018 08/09/2017  Nervous, Anxious, on Edge 3 3 2 1   Control/stop worrying 3 3 2 2   Worry too much - different things 2 3 2 2   Trouble relaxing 2 2 1 1   Restless 1 1 1  0  Easily annoyed or irritable 2 2 1 1   Afraid - awful might happen 2 1 1 1   Total GAD  7 Score 15 15 10 8   Anxiety Difficulty Somewhat difficult Somewhat difficult Somewhat difficult -  Some encounter information is confidential and restricted. Go to Review Flowsheets activity to see all data.      All questions at time of visit were answered - patient instructed to contact office with any additional concerns or updates.  ER/RTC precautions were reviewed with the patient.  Please note: voice recognition software was used to produce this document, and typos may escape review. Please contact Dr. Sheppard Coil for any needed clarifications.

## 2019-05-04 NOTE — Progress Notes (Signed)
Cardiology Office Note:    Date:  05/05/2019   ID:  Tracey Yoder, DOB 04/10/1990, MRN [id number]  PCP:  Sunnie Nielsen, DO  Cardiologist:  Norman Herrlich, MD   Referring MD: Sunnie Nielsen, DO  ASSESSMENT:    1. POTS (postural orthostatic tachycardia syndrome)    PLAN:    In order of problems listed above:  1. On evaluation today she does not fulfill criteria for pots syndrome.  She has orthostatic symptoms at times has heart rate changes and I think the problem is predominantly related to her neuropathy and we discussed mechanisms to mitigate symptoms including adding salt tablets support hose and abdominal binder.  I have offered to have her come back and see me in the office in 6 weeks to see if she responds any other options for treatment he would be to do tilt table testing which is not being done at this point in time locally.  Unfortunately she was quite frustrated and little tearful in the office  Next appointment 6 weeks   Medication Adjustments/Labs and Tests Ordered: Current medicines are reviewed at length with the patient today.  Concerns regarding medicines are outlined above.  Orders Placed This Encounter  Procedures  . EKG 12-Lead   No orders of the defined types were placed in this encounter.    No chief complaint on file.   History of Present Illness:    Tracey Yoder is a 29 y.o. female who is being seen today for the evaluation of orthostatic tachycardia at the request of Sunnie Nielsen, DO. I reviewed her previous cardiac studies.  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  For several years she has been having palpitation she has had episodes when she stands with near syncope visual graying and aware of her heart beating rapidly and has had heart rates above 140 on standing.  She is concerned that she has pots syndrome.  She has not had syncope she takes a antidepressant but the symptoms preceded been unaffected by the  medication.  She also takes gabapentin because of the lower extremity neuropathy.  The symptoms have been present for 2 years and have progressed and are now interfering with her life associated with working nights has become sedentary.  She has not had syncope she has no exertional chest pain edema shortness of breath. Holter 11/16/2016: Study Highlights  Normal sinus rhythm, sinus bradycardia and sinus tachycardia with average heart rate 79bpm. The heart rate ranged from 47 to 149bpm.  Occasional PACs and atrial couplets  Rare PVCs       Echo 08/27/20178:     - Left ventricle: The cavity size was normal. Systolic function was normal. The estimated            ejection fraction was in the range of 60% to 65%. Wall motion was normal; there were no         regional wall motion abnormalities.  Past Medical History:  Diagnosis Date  . Anxiety   . Depression   . Genitofemoral neuralgia of right side 04/11/2018    Past Surgical History:  Procedure Laterality Date  . NO PAST SURGERIES      Current Medications: Current Meds  Medication Sig  . ALPRAZolam (XANAX) 0.25 MG tablet Take 1 tablet (0.25 mg total) by mouth 2 (two) times daily as needed for anxiety.  . gabapentin (NEURONTIN) 300 MG capsule Take 1 capsule (300 mg total) by mouth 2 (two) times daily.  . Levonorgestrel (  KYLEENA) 19.5 MG IUD by Intrauterine route.  . norgestrel-ethinyl estradiol (CRYSELLE-28) 0.3-30 MG-MCG tablet Take 1 tablet by mouth daily. In addition to IUD, ~endometriosis  . vortioxetine HBr (TRINTELLIX) 10 MG TABS tablet Take 1 tablet (10 mg total) by mouth daily.     Allergies:   Penicillins   Social History   Socioeconomic History  . Marital status: Single    Spouse name: Not on file  . Number of children: 0  . Years of education: BA  . Highest education level: Not on file  Occupational History  . Occupation: EMS  Tobacco Use  . Smoking status: Never Smoker  . Smokeless tobacco: Never Used    Substance and Sexual Activity  . Alcohol use: Yes    Alcohol/week: 0.0 standard drinks    Comment: Occ  . Drug use: No  . Sexual activity: Yes    Birth control/protection: I.U.D.  Other Topics Concern  . Not on file  Social History Narrative   Lives alone   Caffeine use: 10-2 cup coffee per day   Right handed    Social Determinants of Health   Financial Resource Strain:   . Difficulty of Paying Living Expenses: Not on file  Food Insecurity:   . Worried About Charity fundraiser in the Last Year: Not on file  . Ran Out of Food in the Last Year: Not on file  Transportation Needs:   . Lack of Transportation (Medical): Not on file  . Lack of Transportation (Non-Medical): Not on file  Physical Activity:   . Days of Exercise per Week: Not on file  . Minutes of Exercise per Session: Not on file  Stress:   . Feeling of Stress : Not on file  Social Connections:   . Frequency of Communication with Friends and Family: Not on file  . Frequency of Social Gatherings with Friends and Family: Not on file  . Attends Religious Services: Not on file  . Active Member of Clubs or Organizations: Not on file  . Attends Archivist Meetings: Not on file  . Marital Status: Not on file     Family History: The patient's family history includes Anemia in her sister; Arthritis in her maternal aunt; Cancer in her father and mother; Depression in her brother, father, mother, and sister; Diabetes in her father; Heart attack in her father, maternal aunt, and maternal grandmother.  ROS:   Review of Systems  Constitution: Positive for malaise/fatigue.  HENT: Negative.   Eyes: Negative.   Cardiovascular: Positive for palpitations.  Respiratory: Negative.   Endocrine: Negative.   Hematologic/Lymphatic: Negative.   Skin: Negative.   Musculoskeletal: Negative.   Gastrointestinal: Negative.   Genitourinary: Negative.   Neurological: Positive for paresthesias.  Psychiatric/Behavioral:  Negative.   Allergic/Immunologic: Negative.    Please see the history of present illness.     All other systems reviewed and are negative.  EKGs/Labs/Other Studies Reviewed:    The following studies were reviewed today:   EKG:  EKG is  ordered today.  The ekg ordered today is personally reviewed and demonstrates sinus rhythm and is normal  Recent Labs: Recent BMP Hgb and TSH are normal 04/07/2019: ALT 15; BUN 11; Creat 0.68; Hemoglobin 13.1; Platelets 259; Potassium 3.9; Sodium 138; TSH 2.12  Recent Lipid Panel    Component Value Date/Time   CHOL 181 04/07/2019 1514   TRIG 73 04/07/2019 1514   HDL 52 04/07/2019 1514   CHOLHDL 3.5 04/07/2019 1514   LDLCALC 113 (  H) 04/07/2019 1514    Physical Exam:    VS:  BP 102/70   Pulse 78   Temp (!) 97.2 F (36.2 C)   Ht 5' (1.524 m)   Wt 139 lb (63 kg)   SpO2 99%   BMI 27.15 kg/m     Wt Readings from Last 3 Encounters:  05/05/19 139 lb (63 kg)  05/02/19 137 lb (62.1 kg)  04/13/19 136 lb 9.6 oz (62 kg)    Blood pressure by me supine 104/56 heart rate 84 Standing 110/70 heart rate 104 bpm GEN: Small stature thin well nourished, well developed in no acute distress HEENT: Normal NECK: No JVD; No carotid bruits LYMPHATICS: No lymphadenopathy CARDIAC: RRR, no murmurs, rubs, gallops RESPIRATORY:  Clear to auscultation without rales, wheezing or rhonchi  ABDOMEN: Soft, non-tender, non-distended MUSCULOSKELETAL:  No edema; No deformity  SKIN: Warm and dry NEUROLOGIC:  Alert and oriented x 3 PSYCHIATRIC:  Normal affect     Signed, Norman Herrlich, MD  05/05/2019 4:32 PM    [facility]

## 2019-05-05 ENCOUNTER — Encounter: Payer: Self-pay | Admitting: Cardiology

## 2019-05-05 ENCOUNTER — Other Ambulatory Visit: Payer: Self-pay

## 2019-05-05 ENCOUNTER — Ambulatory Visit (INDEPENDENT_AMBULATORY_CARE_PROVIDER_SITE_OTHER): Payer: 59 | Admitting: Cardiology

## 2019-05-05 VITALS — BP 102/70 | HR 78 | Temp 97.2°F | Ht 60.0 in | Wt 139.0 lb

## 2019-05-05 DIAGNOSIS — I498 Other specified cardiac arrhythmias: Secondary | ICD-10-CM | POA: Diagnosis not present

## 2019-05-05 DIAGNOSIS — G90A Postural orthostatic tachycardia syndrome (POTS): Secondary | ICD-10-CM

## 2019-05-05 NOTE — Patient Instructions (Signed)
Medication Instructions:  Your physician recommends that you continue on your current medications as directed. Please refer to the Current Medication list given to you today.  *If you need a refill on your cardiac medications before your next appointment, please call your pharmacy*  Lab Work: None ordered  If you have labs (blood work) drawn today and your tests are completely normal, you will receive your results only by: Marland Kitchen MyChart Message (if you have MyChart) OR . A paper copy in the mail If you have any lab test that is abnormal or we need to change your treatment, we will call you to review the results.  Testing/Procedures: None ordered  Follow-Up: At [facility], you and your health needs are our priority.  As part of our continuing mission to provide you with exceptional heart care, we have created designated Provider Care Teams.  These Care Teams include your primary Cardiologist (physician) and Advanced Practice Providers (APPs -  Physician Assistants and Nurse Practitioners) who all work together to provide you with the care you need, when you need it.  Your next appointment:   6 week(s)  The format for your next appointment:   In Person  Provider:   Norman Herrlich, MD  Other Instructions Get some Over The Counter Salt tabs.  Take 1-2 a day Get some knee high support compression stockings, Medium, 20-30 If you continue to have ongoing symptoms, you may have to get an abdominal binder.  May need to see if Orthopedics has a child one you could fit

## 2019-05-06 ENCOUNTER — Other Ambulatory Visit: Payer: Self-pay | Admitting: Osteopathic Medicine

## 2019-06-28 ENCOUNTER — Ambulatory Visit: Payer: 59 | Admitting: Cardiology

## 2020-04-16 ENCOUNTER — Encounter: Payer: Self-pay | Admitting: Neurology

## 2020-04-16 ENCOUNTER — Other Ambulatory Visit: Payer: Self-pay

## 2020-04-16 ENCOUNTER — Ambulatory Visit: Payer: 59 | Admitting: Neurology

## 2020-04-16 VITALS — BP 103/56 | HR 63 | Ht 60.0 in | Wt 150.0 lb

## 2020-04-16 DIAGNOSIS — G588 Other specified mononeuropathies: Secondary | ICD-10-CM | POA: Diagnosis not present

## 2020-04-16 DIAGNOSIS — R202 Paresthesia of skin: Secondary | ICD-10-CM

## 2020-04-16 DIAGNOSIS — G5781 Other specified mononeuropathies of right lower limb: Secondary | ICD-10-CM

## 2020-04-16 DIAGNOSIS — R200 Anesthesia of skin: Secondary | ICD-10-CM

## 2020-04-16 NOTE — Patient Instructions (Signed)
Check MRI brain and cervical spine  Check kidney function  See you back in 3-4 months

## 2020-04-16 NOTE — Progress Notes (Signed)
PATIENT: Tracey Yoder DOB: Mar 13, 1991  REASON FOR VISIT: follow up HISTORY FROM: patient  HISTORY OF PRESENT ILLNESS: Today 04/16/20 Tracey Yoder is a 30 year old female with history of right genitofemoral neuropathy consisted of aching sensations to the right groin area.  MRI of the pelvis did not show any etiology for the neuropathy.  No history of diabetes.  Laboratory evaluation was unremarkable.  No longer taking gabapentin.  Is on Kyleena for birth control, also endometriosis.   Over the last year, has noted a new numb spot to the right side of her neck, and right ankle.  Feels like numb from dental procedure, like its swollen and enlarged.  Is not painful.  Also feels her balance is off, especially if she has sudden movement, has not fallen. Works as Radiation protection practitioner.  Has been diagnosed with POTS over the last year.  Is on metoprolol, doing much better.  No numbness or weakness to the arms or legs.  No changes to the vision, or B/B. B12 378 in Jan 2021. January 2020 ANA, RF, B burgdorfi, sed rate, Pan-ANCA were unremarkable.  No rash noted today. Here today for evaluation unaccompanied.  HISTORY  04/13/2019 SS: Tracey Yoder is a 30 year old female with history of right genitofemoral neuropathy.  She has aching sensations to the persistent right groin area.  MRI of the pelvis did not show any etiology for the neuropathy.  She does not have history of diabetes.  Laboratory evaluation (ANA, rheumatoid factor, b. burgdorfi, sed rate, Pan-ANCA) were unremarkable.  She has recently been started on Trintellix.  She indicates the sensation is stable.  She has noticed the area has expanded may be about 2 inches down her upper thigh.  She describes a sensation of numbness, at times is achy. Sensation of clothing touching is not uncomfortable.  She is being treated with 2 types of birth control for endometriosis.  Gabapentin works well for the sensations.  She works as a Radiation protection practitioner.Her B12 level  was recently normal 378.  She presents today for evaluation unaccompanied.  REVIEW OF SYSTEMS: Out of a complete 14 system review of symptoms, the patient complains only of the following symptoms, and all other reviewed systems are negative.  Numbness  ALLERGIES: Allergies  Allergen Reactions  . Penicillins Other (See Comments)    HOME MEDICATIONS: Outpatient Medications Prior to Visit  Medication Sig Dispense Refill  . ALPRAZolam (XANAX) 0.25 MG tablet Take 1 tablet (0.25 mg total) by mouth 2 (two) times daily as needed for anxiety. 20 tablet 0  . gabapentin (NEURONTIN) 300 MG capsule Take 1 capsule (300 mg total) by mouth 2 (two) times daily. 180 capsule 3  . Levonorgestrel (KYLEENA) 19.5 MG IUD by Intrauterine route.    . metoprolol succinate (TOPROL-XL) 25 MG 24 hr tablet Take by mouth.    . vortioxetine HBr (TRINTELLIX) 10 MG TABS tablet Take 1 tablet (10 mg total) by mouth daily. 90 tablet 0  . norgestrel-ethinyl estradiol (CRYSELLE-28) 0.3-30 MG-MCG tablet Take 1 tablet by mouth daily. In addition to IUD, ~endometriosis 28 tablet 12   No facility-administered medications prior to visit.    PAST MEDICAL HISTORY: Past Medical History:  Diagnosis Date  . Anxiety   . Depression   . Genitofemoral neuralgia of right side 04/11/2018    PAST SURGICAL HISTORY: Past Surgical History:  Procedure Laterality Date  . NO PAST SURGERIES      FAMILY HISTORY: Family History  Problem Relation Age of Onset  . Cancer Mother  skin  . Depression Mother   . Cancer Father        skin  . Diabetes Father   . Depression Father   . Heart attack Father   . Depression Sister   . Depression Brother   . Arthritis Maternal Aunt        rheumatoid  . Heart attack Maternal Grandmother   . Heart attack Maternal Aunt   . Anemia Sister     SOCIAL HISTORY: Social History   Socioeconomic History  . Marital status: Single    Spouse name: Not on file  . Number of children: 0  .  Years of education: BA  . Highest education level: Not on file  Occupational History  . Occupation: EMS  Tobacco Use  . Smoking status: Never Smoker  . Smokeless tobacco: Never Used  Vaping Use  . Vaping Use: Never used  Substance and Sexual Activity  . Alcohol use: Yes    Alcohol/week: 0.0 standard drinks    Comment: Occ  . Drug use: No  . Sexual activity: Yes    Birth control/protection: I.U.D.  Other Topics Concern  . Not on file  Social History Narrative   Lives alone   Caffeine use: 10-2 cup coffee per day   Right handed    Social Determinants of Health   Financial Resource Strain: Not on file  Food Insecurity: Not on file  Transportation Needs: Not on file  Physical Activity: Not on file  Stress: Not on file  Social Connections: Not on file  Intimate Partner Violence: Not on file   PHYSICAL EXAM  Vitals:   04/16/20 0937  BP: (!) 103/56  Pulse: 63  Weight: 150 lb (68 kg)  Height: 5' (1.524 m)   Body mass index is 29.29 kg/m.  Generalized: Well developed, in no acute distress, well-appearing Neurological examination  Mentation: Alert oriented to time, place, history taking. Follows all commands speech and language fluent Cranial nerve II-XII: Pupils were equal round reactive to light. Extraocular movements were full, visual field were full on confrontational test. Facial sensation and strength were normal. Head turning and shoulder shrug  were normal and symmetric. Motor: The motor testing reveals 5 over 5 strength of all 4 extremities. Good symmetric motor tone is noted throughout.  Sensory: Sensory testing is intact to soft touch on all 4 extremities. No evidence of extinction is noted. Right mid neck to right mid collar bone altered touch, is present but decreased, also right lateral malleolus from 3:00-9:00 numbness sensation, decreased both sites to pinprick Coordination: Cerebellar testing reveals good finger-nose-finger and heel-to-shin bilaterally.   Gait and station: Gait is normal. Tandem gait is normal. Romberg is negative. No drift is seen.  Reflexes: Deep tendon reflexes are symmetric but brisk in the knees   DIAGNOSTIC DATA (LABS, IMAGING, TESTING) - I reviewed patient records, labs, notes, testing and imaging myself where available.  Lab Results  Component Value Date   WBC 4.7 04/07/2019   HGB 13.1 04/07/2019   HCT 38.5 04/07/2019   MCV 90.2 04/07/2019   PLT 259 04/07/2019      Component Value Date/Time   NA 138 04/07/2019 1514   K 3.9 04/07/2019 1514   CL 105 04/07/2019 1514   CO2 27 04/07/2019 1514   GLUCOSE 85 04/07/2019 1514   BUN 11 04/07/2019 1514   CREATININE 0.68 04/07/2019 1514   CALCIUM 8.9 04/07/2019 1514   PROT 6.7 04/07/2019 1514   ALBUMIN 4.2 10/27/2016 1529   AST  14 04/07/2019 1514   ALT 15 04/07/2019 1514   ALKPHOS 42 10/27/2016 1529   BILITOT 0.7 04/07/2019 1514   GFRNONAA 121 04/07/2019 1514   GFRAA 140 04/07/2019 1514   Lab Results  Component Value Date   CHOL 181 04/07/2019   HDL 52 04/07/2019   LDLCALC 113 (H) 04/07/2019   TRIG 73 04/07/2019   CHOLHDL 3.5 04/07/2019   Lab Results  Component Value Date   HGBA1C 4.8 03/18/2016   Lab Results  Component Value Date   VITAMINB12 378 04/07/2019   Lab Results  Component Value Date   TSH 2.12 04/07/2019    ASSESSMENT AND PLAN 30 y.o. year old female  has a past medical history of Anxiety, Depression, and Genitofemoral neuralgia of right side (04/11/2018). here with:  1.  Genitofemoral neuralgia of the right side 2.  New onset numbness to right neck, right ankle onset over the last year  -I will check MRI of the brain, cervical spine with and without contrast due to new onset numbness, also reflexes brisk in the knees, report of trouble with balance  -She stopped gabapentin, will stay off this -Check creatinine before MRI with contrast, previous ANA, rheumatoid factor, b. Burgdorfi, sed rate, pan-ANCA have been negative -Have her  follow-up in 3 to 4 months or sooner if needed with Dr. Anne Hahn  I spent 30 minutes of face-to-face and non-face-to-face time with patient.  This included previsit chart review, lab review, study review, order entry, electronic health record documentation, patient education.  Margie Ege, AGNP-C, DNP 04/16/2020, 10:10 AM [facility] [address], [address] [place], [place] [address] [phone]

## 2020-04-16 NOTE — Progress Notes (Signed)
I have read the note, and I agree with the clinical assessment and plan.  Derenda Giddings K Osie Merkin   

## 2020-04-17 ENCOUNTER — Telehealth: Payer: Self-pay | Admitting: Neurology

## 2020-04-17 ENCOUNTER — Telehealth: Payer: Self-pay

## 2020-04-17 LAB — CREATININE, SERUM
Creatinine, Ser: 0.68 mg/dL (ref 0.57–1.00)
GFR calc Af Amer: 138 mL/min/{1.73_m2} (ref >59)
GFR calc non Af Amer: 119 mL/min/{1.73_m2} (ref >59)

## 2020-04-17 NOTE — Telephone Encounter (Signed)
-----   Message from Glean Salvo, NP sent at 04/17/2020  6:07 AM EST ----- Sent my chart message: Tracey Yoder,  Blood work is normal, okay for contrast. Will update with results when your MRIs return. Take Care, Maralyn Sago

## 2020-04-17 NOTE — Telephone Encounter (Signed)
LVM for patient to call back about scheduling the MRI's.  [employer] [facility]: [id number] & [phone] (exp. 04/17/20 to 06/01/20)

## 2020-05-05 ENCOUNTER — Encounter: Payer: Self-pay | Admitting: Neurology

## 2020-05-06 NOTE — Telephone Encounter (Signed)
Patient is scheduled at [facility] for 05/15/20.

## 2020-05-09 ENCOUNTER — Other Ambulatory Visit: Payer: Self-pay | Admitting: Osteopathic Medicine

## 2020-05-15 ENCOUNTER — Ambulatory Visit: Payer: 59

## 2020-05-15 DIAGNOSIS — R202 Paresthesia of skin: Secondary | ICD-10-CM

## 2020-05-15 MED ORDER — GADOBENATE DIMEGLUMINE 529 MG/ML IV SOLN
15.0000 mL | Freq: Once | INTRAVENOUS | Status: AC | PRN
  Administered 2020-05-15: 15 mL via INTRAVENOUS

## 2020-05-20 ENCOUNTER — Telehealth: Payer: Self-pay | Admitting: Neurology

## 2020-05-20 DIAGNOSIS — R200 Anesthesia of skin: Secondary | ICD-10-CM

## 2020-05-20 DIAGNOSIS — R202 Paresthesia of skin: Secondary | ICD-10-CM

## 2020-05-20 NOTE — Telephone Encounter (Signed)
Please let the patient know.  MRI of cervical spine with and without was normal.  MRI of the brain with and without contrast was normal.  Nothing to explain report of new numbness to right neck, right ankle, report of trouble with balance, brisk reflexes in the knees.   Will check EMG/NCV at this point with Dr. Anne Hahn.

## 2020-06-05 ENCOUNTER — Other Ambulatory Visit: Payer: Self-pay | Admitting: Nurse Practitioner

## 2020-06-17 ENCOUNTER — Ambulatory Visit (INDEPENDENT_AMBULATORY_CARE_PROVIDER_SITE_OTHER): Payer: 59 | Admitting: Neurology

## 2020-06-17 ENCOUNTER — Encounter: Payer: Self-pay | Admitting: Neurology

## 2020-06-17 ENCOUNTER — Other Ambulatory Visit: Payer: Self-pay

## 2020-06-17 DIAGNOSIS — G588 Other specified mononeuropathies: Secondary | ICD-10-CM

## 2020-06-17 DIAGNOSIS — R202 Paresthesia of skin: Secondary | ICD-10-CM

## 2020-06-17 DIAGNOSIS — R200 Anesthesia of skin: Secondary | ICD-10-CM

## 2020-06-17 DIAGNOSIS — M545 Low back pain, unspecified: Secondary | ICD-10-CM

## 2020-06-17 DIAGNOSIS — G8929 Other chronic pain: Secondary | ICD-10-CM

## 2020-06-17 NOTE — Progress Notes (Signed)
Please refer to EMG and nerve conduction procedure note.  

## 2020-06-17 NOTE — Progress Notes (Addendum)
The patient comes in for EMG nerve conduction study evaluation today.  The studies were normal, the patient is having some low back pain and some tingling in the right ankle and in the right shoulder at times.  MRI of the brain and cervical spine been normal.  I will get the patient set up for some physical therapy working on the low back pain and balance.        MNC    Nerve / Sites Muscle Latency Ref. Amplitude Ref. Rel Amp Segments Distance Velocity Ref. Area    ms ms mV mV %  cm m/s m/s mVms  R Median - APB     Wrist APB 3.3 ?4.4 8.4 ?4.0 100 Wrist - APB 7   34.3     Upper arm APB 6.1  8.0  95.6 Upper arm - Wrist 19 66 ?49 32.1  R Ulnar - ADM     Wrist ADM 2.7 ?3.3 12.5 ?6.0 100 Wrist - ADM 7   49.5     B.Elbow ADM 5.0  11.8  94.1 B.Elbow - Wrist 16 69 ?49 47.7     A.Elbow ADM 6.5  11.5  98.1 A.Elbow - B.Elbow 10 67 ?49 47.9  R Peroneal - EDB     Ankle EDB 4.5 ?6.5 5.4 ?2.0 100 Ankle - EDB 9   26.7     Fib head EDB 8.5  7.5  137 Fib head - Ankle 25 62 ?44 31.8     Pop fossa EDB 10.2  7.3  97.7 Pop fossa - Fib head 10 59 ?44 31.1         Pop fossa - Ankle      R Tibial - AH     Ankle AH 4.9 ?5.8 9.6 ?4.0 100 Ankle - AH 9   34.3     Pop fossa AH 11.3  7.3  76.4 Pop fossa - Ankle 33 52 ?41 28.2             SNC    Nerve / Sites Rec. Site Peak Lat Ref.  Amp Ref. Segments Distance    ms ms V V  cm  R Sural - Ankle (Calf)     Calf Ankle 3.1 ?4.4 23 ?6 Calf - Ankle 14  R Superficial peroneal - Ankle     Lat leg Ankle 3.4 ?4.4 9 ?6 Lat leg - Ankle 14  R Median - Orthodromic (Dig II, Mid palm)     Dig II Wrist 2.6 ?3.4 10 ?10 Dig II - Wrist 13  R Ulnar - Orthodromic, (Dig V, Mid palm)     Dig V Wrist 2.5 ?3.1 11 ?5 Dig V - Wrist 26             F  Wave    Nerve F Lat Ref.   ms ms  R Tibial - AH 44.1 ?56.0  R Ulnar - ADM 24.8 ?32.0

## 2020-06-17 NOTE — Procedures (Signed)
     HISTORY:  Tracey Yoder is a 30 year old patient with a history of intermittent numbness and tingling of the right shoulder and right ankle, she has some problems with mid thoracic and low back pain that comes and goes.  She is being evaluated for these issues.  NERVE CONDUCTION STUDIES: Nerve conduction studies were performed on the right upper extremity. The distal motor latencies and motor amplitudes for the median and ulnar nerves were within normal limits. The nerve conduction velocities for these nerves were also normal. The sensory latencies for the median and ulnar nerves were normal. The F wave latency for the ulnar nerve was within normal limits.  Nerve conduction studies were performed on the right lower extremity. The distal motor latencies and motor amplitudes for the peroneal and posterior tibial nerves were within normal limits. The nerve conduction velocities for these nerves were also normal. The sensory latencies for the peroneal and sural nerves were within normal limits. The F wave latency for the posterior tibial nerve was within normal limits.   EMG STUDIES:  EMG study was performed on the right lower extremity:  The tibialis anterior muscle reveals 2 to 4K motor units with full recruitment. No fibrillations or positive waves were seen. The peroneus tertius muscle reveals 2 to 4K motor units with full recruitment. No fibrillations or positive waves were seen. The medial gastrocnemius muscle reveals 1 to 3K motor units with full recruitment. No fibrillations or positive waves were seen. The vastus lateralis muscle reveals 2 to 4K motor units with full recruitment. No fibrillations or positive waves were seen. The iliopsoas muscle reveals 2 to 4K motor units with full recruitment. No fibrillations or positive waves were seen. The biceps femoris muscle (long head) reveals 2 to 4K motor units with full recruitment. No fibrillations or positive waves were seen. The  lumbosacral paraspinal muscles were tested at 3 levels, and revealed no abnormalities of insertional activity at all 3 levels tested. There was fair relaxation.   IMPRESSION:  Nerve conduction studies done on the right upper and right lower extremities were within normal limits, no evidence of a neuropathy was seen.  EMG evaluation of the right lower extremity was unremarkable, without evidence of an overlying lumbar radiculopathy.  Marlan Palau MD 06/17/2020 2:26 PM  [facility] [address] [name], [place] [address]  Phone [phone] Fax [phone]

## 2020-06-25 ENCOUNTER — Encounter: Payer: Self-pay | Admitting: Osteopathic Medicine

## 2020-06-25 ENCOUNTER — Telehealth (INDEPENDENT_AMBULATORY_CARE_PROVIDER_SITE_OTHER): Payer: 59 | Admitting: Osteopathic Medicine

## 2020-06-25 VITALS — Wt 150.0 lb

## 2020-06-25 DIAGNOSIS — F32A Depression, unspecified: Secondary | ICD-10-CM

## 2020-06-25 DIAGNOSIS — F419 Anxiety disorder, unspecified: Secondary | ICD-10-CM | POA: Diagnosis not present

## 2020-06-25 MED ORDER — VORTIOXETINE HBR 10 MG PO TABS: 10.0000 mg | ORAL_TABLET | Freq: Every day | ORAL | 3 refills | Status: DC

## 2020-06-25 MED ORDER — ONDANSETRON 8 MG PO TBDP: 8.0000 mg | ORAL_TABLET | Freq: Three times a day (TID) | ORAL | 3 refills | Status: AC | PRN

## 2020-06-25 NOTE — Progress Notes (Signed)
Telemedicine Visit via  Audio only - telephone (patient preference /  technical difficulty with MyChart video application)  I connected with Tracey Yoder on 06/25/20 at 1:13 PM  by phone or  telemedicine application as noted above  I verified that I am speaking with or regarding  the correct patient using two identifiers.  Participants: Myself, Dr Sunnie Nielsen DO Patient: Tracey Yoder Patient proxy if applicable: none Other, if applicable: none  Patient is at home I am in office at [facility]    I discussed the limitations of evaluation and management  by telemedicine and the availability of in person appointments.  The participant(s) above expressed understanding and  agreed to proceed with this appointment via telemedicine.       History of Present Illness: Tracey Yoder is a 30 y.o. female who would like to discuss mental health, refill Trintellix.  Reports doing well on current medication, no concerns for worsening anxiety/depression.          Observations/Objective: Wt 150 lb (68 kg)   BMI 29.29 kg/m  BP Readings from Last 3 Encounters:  04/16/20 (!) 103/56  05/05/19 102/70  05/02/19 (!) 152/80   Exam: Normal Speech.    Lab and Radiology Results No results found for this or any previous visit (from the past 72 hour(s)). No results found.     Assessment and Plan: 30 y.o. female with The encounter diagnosis was Anxiety and depression.   PDMP not reviewed this encounter. No orders of the defined types were placed in this encounter.  Meds ordered this encounter  Medications  . vortioxetine HBr (TRINTELLIX) 10 MG TABS tablet    Sig: Take 1 tablet (10 mg total) by mouth daily.    Dispense:  90 tablet    Refill:  3  . ondansetron (ZOFRAN-ODT) 8 MG disintegrating tablet    Sig: Take 1 tablet (8 mg total) by mouth every 8 (eight) hours as needed for nausea.    Dispense:  20 tablet    Refill:  3   There are no  Patient Instructions on file for this visit.    Follow Up Instructions: Return in about 6 months (around 12/25/2020) for ANNUAL (call week prior to visit for lab orders).    I discussed the assessment and treatment plan with the patient. The patient was provided an opportunity to ask questions and all were answered. The patient agreed with the plan and demonstrated an understanding of the instructions.   The patient was advised to call back or seek an in-person evaluation if any new concerns, if symptoms worsen or if the condition fails to improve as anticipated.  15 minutes of non-face-to-face time was provided during this encounter.      . . . . . . . . . . . . . Marland Kitchen           Depression screen [facility] 2/9 04/04/2019 02/07/2019 05/12/2018  Decreased Interest 2 1 1   Down, Depressed, Hopeless 2 1 1   PHQ - 2 Score 4 2 2   Altered sleeping 3 1 2   Tired, decreased energy 3 1 2   Change in appetite 1 0 0  Feeling bad or failure about yourself  1 2 2   Trouble concentrating 2 0 0  Moving slowly or fidgety/restless 1 0 0  Suicidal thoughts 0 0 0  PHQ-9 Score 15 6 8   Difficult doing work/chores Somewhat difficult Somewhat difficult Not difficult at all  Some encounter information is confidential and restricted. Go to  Review Flowsheets activity to see all data.           Historical information moved to improve visibility of documentation.  Past Medical History:  Diagnosis Date  . Anxiety   . Depression   . Genitofemoral neuralgia of right side 04/11/2018   Past Surgical History:  Procedure Laterality Date  . NO PAST SURGERIES     Social History   Tobacco Use  . Smoking status: Never Smoker  . Smokeless tobacco: Never Used  Substance Use Topics  . Alcohol use: Yes    Alcohol/week: 0.0 standard drinks    Comment: Occ   family history includes Anemia in her sister; Arthritis in her maternal aunt; Cancer in her father and mother; Depression in her brother,  father, mother, and sister; Diabetes in her father; Heart attack in her father, maternal aunt, and maternal grandmother.  Medications: Current Outpatient Medications  Medication Sig Dispense Refill  . ALPRAZolam (XANAX) 0.25 MG tablet Take 1 tablet (0.25 mg total) by mouth 2 (two) times daily as needed for anxiety. 20 tablet 0  . gabapentin (NEURONTIN) 300 MG capsule Take 1 capsule (300 mg total) by mouth 2 (two) times daily. 180 capsule 3  . Levonorgestrel (KYLEENA) 19.5 MG IUD by Intrauterine route.    . metoprolol succinate (TOPROL-XL) 25 MG 24 hr tablet Take by mouth.    . ondansetron (ZOFRAN-ODT) 8 MG disintegrating tablet Take 1 tablet (8 mg total) by mouth every 8 (eight) hours as needed for nausea. 20 tablet 3  . vortioxetine HBr (TRINTELLIX) 10 MG TABS tablet Take 1 tablet (10 mg total) by mouth daily. 90 tablet 3   No current facility-administered medications for this visit.   Allergies  Allergen Reactions  . Penicillins Other (See Comments) and Itching     If phone visit, billing and coding can please add appropriate modifier if needed

## 2020-07-17 ENCOUNTER — Ambulatory Visit: Payer: 59 | Admitting: Neurology

## 2020-08-14 ENCOUNTER — Other Ambulatory Visit: Payer: Self-pay

## 2020-08-14 ENCOUNTER — Ambulatory Visit: Payer: 59 | Admitting: Medical-Surgical

## 2020-08-14 ENCOUNTER — Encounter: Payer: Self-pay | Admitting: Medical-Surgical

## 2020-08-14 VITALS — BP 96/60 | HR 68 | Temp 98.8°F | Ht 60.0 in | Wt 155.0 lb

## 2020-08-14 DIAGNOSIS — R300 Dysuria: Secondary | ICD-10-CM

## 2020-08-14 DIAGNOSIS — N898 Other specified noninflammatory disorders of vagina: Secondary | ICD-10-CM | POA: Diagnosis not present

## 2020-08-14 LAB — POCT URINALYSIS DIP (CLINITEK)
Bilirubin, UA: NEGATIVE
Blood, UA: NEGATIVE
Glucose, UA: NEGATIVE mg/dL
Nitrite, UA: NEGATIVE
POC PROTEIN,UA: NEGATIVE
Spec Grav, UA: 1.02 (ref 1.010–1.025)
Urobilinogen, UA: 1 E.U./dL
pH, UA: 7 (ref 5.0–8.0)

## 2020-08-14 LAB — WET PREP FOR TRICH, YEAST, CLUE
MICRO NUMBER:: 11933552
Specimen Quality: ADEQUATE

## 2020-08-14 MED ORDER — METRONIDAZOLE 500 MG PO TABS: 500.0000 mg | ORAL_TABLET | Freq: Two times a day (BID) | ORAL | 0 refills | Status: AC

## 2020-08-14 NOTE — Addendum Note (Signed)
Addended byChristen Butter on: 08/14/2020 03:29 PM   Modules accepted: Orders

## 2020-08-14 NOTE — Progress Notes (Signed)
Subjective:    CC: painful urination  HPI: Pleasant 30 year old female presenting for evaluation of painful urination.  Notes that for the last 3 days she has had a feeling as if the vaginal tissues are swollen.  There is pain with wiping as well as urinating that is described as achy.  She does have some suprapubic discomfort.  Denies fever, chills, nausea, vomiting, bowel changes, vaginal discharge, and abnormal vaginal bleeding.  She has not had any urinary burning or urgency but notes she is urinating more frequently.  This may be because she has increased her water intake to help flush her system.  She does have a history of UTIs but notes that her symptoms are not likely UTI she is ever had before.  She is sexually active with 1 female partner in a monogamous relationship.  I reviewed the past medical history, family history, social history, surgical history, and allergies today and no changes were needed.  Please see the problem list section below in epic for further details.  Past Medical History: Past Medical History:  Diagnosis Date  . Anxiety   . Depression   . Genitofemoral neuralgia of right side 04/11/2018   Past Surgical History: Past Surgical History:  Procedure Laterality Date  . NO PAST SURGERIES     Social History: Social History   Socioeconomic History  . Marital status: Single    Spouse name: Not on file  . Number of children: 0  . Years of education: BA  . Highest education level: Not on file  Occupational History  . Occupation: EMS  Tobacco Use  . Smoking status: Never Smoker  . Smokeless tobacco: Never Used  Vaping Use  . Vaping Use: Never used  Substance and Sexual Activity  . Alcohol use: Yes    Alcohol/week: 0.0 standard drinks    Comment: Occ  . Drug use: No  . Sexual activity: Yes    Birth control/protection: I.U.D.  Other Topics Concern  . Not on file  Social History Narrative   Lives alone   Caffeine use: 10-2 cup coffee per day   Right  handed    Social Determinants of Health   Financial Resource Strain: Not on file  Food Insecurity: Not on file  Transportation Needs: Not on file  Physical Activity: Not on file  Stress: Not on file  Social Connections: Not on file   Family History: Family History  Problem Relation Age of Onset  . Cancer Mother        skin  . Depression Mother   . Cancer Father        skin  . Diabetes Father   . Depression Father   . Heart attack Father   . Depression Sister   . Depression Brother   . Arthritis Maternal Aunt        rheumatoid  . Heart attack Maternal Grandmother   . Heart attack Maternal Aunt   . Anemia Sister    Allergies: Allergies  Allergen Reactions  . Penicillins Other (See Comments) and Itching   Medications: See med rec.  Review of Systems: See HPI for pertinent positives and negatives.   Objective:    General: Well Developed, well nourished, and in no acute distress.  Neuro: Alert and oriented x3.  HEENT: Normocephalic, atraumatic.  Skin: Warm and dry. Cardiac: Regular rate and rhythm, no murmurs rubs or gallops, no lower extremity edema.  Respiratory: Clear to auscultation bilaterally. Not using accessory muscles, speaking in full sentences. Abdomen: Soft,  mild suprapubic tenderness, nondistended. Bowel sounds + x 4 quadrants. No HSM appreciated.    Impression and Recommendations:    1. Dysuria 2.  Vaginal irritation POCT urinalysis positive for trace ketones and trace leukocytes but negative for nitrites, protein, and blood.  Sending for culture.  Wet prep via self swab completed.  Sending urine for chlamydia and gonorrhea.  Recommend patting the area dry after urination and wiping.  Also may benefit from cool compresses to the area to help with irritation.  Further treatment pending lab results. - POCT URINALYSIS DIP (CLINITEK) - Urine Culture - WET PREP FOR TRICH, YEAST, CLUE - C. trachomatis/N. gonorrhoeae RNA  Return if symptoms worsen or  fail to improve. ___________________________________________ Thayer Ohm, DNP, APRN, FNP-BC Primary Care and Sports Medicine [facility]

## 2020-08-16 LAB — URINE CULTURE
MICRO NUMBER:: 11936366
Result:: NO GROWTH
SPECIMEN QUALITY:: ADEQUATE

## 2020-08-16 LAB — C. TRACHOMATIS/N. GONORRHOEAE RNA
C. trachomatis RNA, TMA: NOT DETECTED
N. gonorrhoeae RNA, TMA: NOT DETECTED

## 2020-11-11 ENCOUNTER — Ambulatory Visit: Payer: 59 | Admitting: Osteopathic Medicine

## 2020-11-18 ENCOUNTER — Ambulatory Visit: Payer: 59 | Admitting: Sports Medicine

## 2021-07-04 ENCOUNTER — Ambulatory Visit: Payer: 59 | Admitting: Sports Medicine

## 2021-07-04 DIAGNOSIS — L7211 Pilar cyst: Secondary | ICD-10-CM

## 2021-07-04 NOTE — Assessment & Plan Note (Addendum)
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 ?

## 2021-07-04 NOTE — Progress Notes (Signed)
? ? ?  Procedures performed today:   ? ?Procedure:  Excision of scalp subcutaneous tumor, subcentimeter ?Risks, benefits, and alternatives explained and consent obtained. ?Time out conducted. ?Surface prepped with alcohol. ?2cc lidocaine with epinephine infiltrated in a field block. ?Adequate anesthesia ensured. ?Area prepped and draped in a sterile fashion. ?Excision performed with: I made an incision longitudinally with a #10 blade, then using blunt dissection I was able to remove the subcutaneous tumor as a whole, it did appear to be a pilar cyst, I then closed the incision with a single horizontal mattress 3-0 Prolene suture. ?Hemostasis achieved. ?Pt stable. ?  ?Procedure:  Excision of scalp subcutaneous tumor, subcentimeter ?Risks, benefits, and alternatives explained and consent obtained. ?Time out conducted. ?Surface prepped with alcohol. ?2cc lidocaine with epinephine infiltrated in a field block. ?Adequate anesthesia ensured. ?Area prepped and draped in a sterile fashion. 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 ?Hemostasis achieved. ?Pt stable. ? ?Independent interpretation of notes and tests performed by another provider:  ? ?None. ? ?Brief History, Exam, Impression, and Recommendations:   ? 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 ? ? ? ?___________________________________________ ?Ihor Austin. Benjamin Stain, M.D., ABFM., CAQSM. ?Primary Care and Sports Medicine ?[facility] ? ?Adjunct Instructor of Family Medicine  ?[employer] ?

## 2021-07-04 NOTE — Patient Instructions (Signed)
Incision Care, Adult An incision is a surgical cut that is made through your skin. Most incisions are closed after a surgical procedure. Your incision may be closed with stitches (sutures), staples, skin glue, or adhesive strips. You may need to return to your health care provider to have sutures or staples removed. This may occur several days or several weeks after your surgery. Until then, the incision needs to be cared for properly to prevent infection. Follow instructions from your health care provider about how to care for your incision. Supplies needed: Soap, water, and a clean hand towel. Wound cleanser. A clean bandage (dressing), if needed. Cream or ointment, if told by your health care provider. Clean gauze. How to care for your incision Cleaning the incision Ask your health care provider how to clean the incision. This may include: Wearing medical gloves. Using mild soap and water, or wound cleanser. Using a clean gauze to pat the incision dry after cleaning it. Dressing changes Wash your hands with soap and water for at least 20 seconds before and after you change the dressing. If soap and water are not available, use hand sanitizer. Do not use disinfectants or antiseptics, such as rubbing alcohol, to clean open wounds unless told by your health care provider. Change your dressing as told by your health care provider. Leave sutures, staples, skin glue, or adhesive strips in place. These skin closures may need to stay in place for 2 weeks or longer. If adhesive strip edges start to loosen and curl up, you may trim the loose edges. Do not remove adhesive strips completely unless your health care provider tells you to do that. Apply cream or ointment. Do this only as told by your health care provider. Cover the incision with a clean dressing. Ask your health care provider when you can start to leave the incision uncovered. Checking for infection Check your incision area every day for  signs of infection. Check for: More redness, swelling, or pain. More fluid or blood. New warmth, hardness, or a rash that develops along the incision. Pus or a bad smell.  Follow these instructions at home Medicines Take over-the-counter and prescription medicines only as told by your health care provider. If you were prescribed an antibiotic medicine, cream, or ointment, take or apply it as told by your health care provider. Do not stop using the antibiotic even if your condition improves. Eating and drinking Eat a diet that includes protein, vitamin A, vitamin C, and other nutrient-rich foods to help the wound heal. Foods rich in protein include meat, fish, eggs, dairy, beans, nuts, and protein supplement drinks. Foods rich in vitamin A include carrots and dark green, leafy vegetables. Foods rich in vitamin C include citrus fruits, tomatoes, broccoli, and peppers. Drink enough fluid to keep your urine pale yellow. General instructions  Do not take baths, swim, use a hot tub, or do anything that would put the incision underwater until your health care provider approves. Ask your health care provider if you may take showers. You may only be allowed to take sponge baths. Limit movement around your incision to promote healing. Avoid straining, lifting, or exercising for the first 2 weeks after your procedure, or for as long as told by your health care provider. Return to your normal activities as told by your health care provider. Ask your health care provider what activities are safe for you. Do not scratch, pick, or scrub the incision. Keep it covered as told by your health care provider.   Protect your incision from the sun when you are outside for the first 6 months, or for as long as told by your health care provider. Cover up the scar area or apply sunscreen that has an SPF of at least 30. Do not use any products that contain nicotine or tobacco, such as cigarettes, e-cigarettes, and  chewing tobacco. These can delay incision healing. If you need help quitting, ask your health care provider. Keep all follow-up visits. This is important. Contact a health care provider if: You have any of these signs of infection: More redness, swelling, or pain around your incision. More fluid or blood coming from your incision. New warmth or hardness around your incision. Pus or a bad smell coming from your incision. A rash that develops along the incision. You feel nauseous or you vomit. You are dizzy. Your sutures, staples, skin glue, or adhesive strips come undone. Your wound gets bigger. You have a fever. Get help right away if: Your incision bleeds through the dressing and the bleeding does not stop with gentle pressure. The edges of your incision open up and separate. These symptoms may represent a serious problem that is an emergency. Do not wait to see if the symptoms will go away. Get medical help right away. Call your local emergency services (911 in the [place] Do not drive yourself to the hospital. Summary Follow instructions from your health care provider about how to care for your incision. Wash your hands with soap and water for at least 20 seconds before and after you change the dressing. If soap and water are not available, use hand sanitizer. Check your incision area every day for signs of infection. Keep all follow-up visits. This is important. This information is not intended to replace advice given to you by your health care provider. Make sure you discuss any questions you have with your health care provider. Document Revised: 06/10/2020 Document Reviewed: 06/10/2020 Elsevier Patient Education  2023 [employer]  

## 2021-07-13 ENCOUNTER — Other Ambulatory Visit: Payer: Self-pay | Admitting: Osteopathic Medicine

## 2022-12-18 ENCOUNTER — Emergency Department (HOSPITAL_BASED_OUTPATIENT_CLINIC_OR_DEPARTMENT_OTHER)
Admission: EM | Admit: 2022-12-18 | Discharge: 2022-12-18 | Disposition: A | Discharge status: Home / Self Care | Payer: Worker's Compensation | Attending: Emergency Medicine | Admitting: Emergency Medicine

## 2022-12-18 ENCOUNTER — Emergency Department (HOSPITAL_BASED_OUTPATIENT_CLINIC_OR_DEPARTMENT_OTHER): Payer: Worker's Compensation

## 2022-12-18 ENCOUNTER — Encounter (HOSPITAL_BASED_OUTPATIENT_CLINIC_OR_DEPARTMENT_OTHER): Payer: Self-pay | Admitting: Emergency Medicine

## 2022-12-18 ENCOUNTER — Other Ambulatory Visit: Payer: Self-pay

## 2022-12-18 DIAGNOSIS — X501XXA Overexertion from prolonged static or awkward postures, initial encounter: Secondary | ICD-10-CM | POA: Diagnosis not present

## 2022-12-18 DIAGNOSIS — M25571 Pain in right ankle and joints of right foot: Secondary | ICD-10-CM | POA: Diagnosis present

## 2022-12-18 DIAGNOSIS — S93401A Sprain of unspecified ligament of right ankle, initial encounter: Secondary | ICD-10-CM | POA: Diagnosis not present

## 2022-12-18 MED ORDER — OXYCODONE HCL 5 MG PO TABS
5.0000 mg | ORAL_TABLET | Freq: Once | ORAL | Status: AC
  Administered 2022-12-18: 5 mg via ORAL
  Filled 2022-12-18: qty 1

## 2022-12-18 MED ORDER — ACETAMINOPHEN 325 MG PO TABS
650.0000 mg | ORAL_TABLET | Freq: Once | ORAL | Status: AC
  Administered 2022-12-18: 650 mg via ORAL
  Filled 2022-12-18: qty 2

## 2022-12-18 NOTE — ED Provider Notes (Signed)
[place] EMERGENCY DEPARTMENT AT [facility] Provider Note   CSN: [id number] Arrival date & time: 12/18/22  1135     History  Chief Complaint  Patient presents with   Ankle Pain    Tracey Yoder is a 32 y.o. female.  Patient twisted right ankle today prior to arrival.  Pain and swelling to the right lateral ankle.  Nothing makes it worse or better.  No significant medical problems otherwise.  Denies any other injury.  The history is provided by the patient.       Home Medications Prior to Admission medications   Medication Sig Start Date End Date Taking? Authorizing Provider  desvenlafaxine (PRISTIQ) 50 MG 24 hr tablet Take 50 mg by mouth daily.   Yes [provider]  ALPRAZolam (XANAX) 0.25 MG tablet Take 1 tablet (0.25 mg total) by mouth 2 (two) times daily as needed for anxiety. 02/07/19   Sunnie Nielsen, DO  Levonorgestrel [facility]) 19.5 MG IUD by Intrauterine route.    [provider]  ondansetron (ZOFRAN-ODT) 8 MG disintegrating tablet Take 1 tablet (8 mg total) by mouth every 8 (eight) hours as needed for nausea. 06/25/20   Sunnie Nielsen, DO  vortioxetine HBr (TRINTELLIX) 10 MG TABS tablet Take 1 tablet (10 mg total) by mouth daily. LAST REFILLS. OVERDUE TO TRANSFER CARE TO NEW PCP 07/14/21   Agapito Games, MD      Allergies    Penicillins    Review of Systems   Review of Systems  Physical Exam Updated Vital Signs BP (!) 141/87 (BP Location: Left Arm)   Pulse (!) 104   Temp 98.4 F (36.9 C) (Oral)   Resp 20   Ht 5' (1.524 m)   Wt 77.1 kg   SpO2 100%   BMI 33.20 kg/m  Physical Exam Constitutional:      General: She is not in acute distress.    Appearance: She is not ill-appearing.  Cardiovascular:     Pulses: Normal pulses.  Pulmonary:     Effort: Pulmonary effort is normal.  Musculoskeletal:        General: Swelling and tenderness present. No deformity. Normal range of motion.     Cervical back:  Normal range of motion.     Comments: Patient with pain and swelling to the right lateral malleolus, normal range of motion, no obvious deformity  Neurological:     General: No focal deficit present.     Mental Status: She is alert.     ED Results / Procedures / Treatments   Labs (all labs ordered are listed, but only abnormal results are displayed) Labs Reviewed - No data to display  EKG None  Radiology DG Ankle Complete Right  Result Date: 12/18/2022 CLINICAL DATA:  [id number] Ankle injury [id number] EXAM: RIGHT ANKLE - COMPLETE 3+ VIEW COMPARISON:  None Available. FINDINGS: Bone mineralization within normal limits for patient's age. No acute fracture or dislocation. No aggressive osseous lesion. Ankle mortise appears intact. No focal soft tissue swelling. No radiopaque foreign bodies. IMPRESSION: 1. Negative. Electronically Signed   By: Jules Schick M.D.   On: 12/18/2022 13:06    Procedures Procedures    Medications Ordered in ED Medications  acetaminophen (TYLENOL) tablet 650 mg (650 mg Oral Given 12/18/22 1151)  oxyCODONE (Oxy IR/ROXICODONE) immediate release tablet 5 mg (5 mg Oral Given 12/18/22 1328)    ED Course/ Medical Decision Making/ A&P  Medical Decision Making Amount and/or Complexity of Data Reviewed Radiology: ordered.  Risk OTC drugs. Prescription drug management.   Demiah Gullickson is here with right ankle pain after fall.  Differential diagnosis sprain versus fracture.  She is got some swelling to the lateral malleolus of the right ankle.  Patient given Tylenol and oxycodone.  Per my review interpretation of x-ray there is no fracture or dislocation.  Overall suspect the sprain.  Neurovascular neuromuscular intact.  Placed in a cam boot walker.  Recommend ice, Tylenol, ibuprofen and rest.  Follow-up with orthopedics.  Discharged in good condition.  This chart was dictated using voice recognition software.  Despite best efforts  to proofread,  errors can occur which can change the documentation meaning.         Final Clinical Impression(s) / ED Diagnoses Final diagnoses:  Sprain of right ankle, unspecified ligament, initial encounter    Rx / DC Orders ED Discharge Orders     None         Virgina Norfolk, DO 12/18/22 1346

## 2022-12-18 NOTE — ED Triage Notes (Signed)
Pt arrived POV from work c/o R ankle pain. Pt states she stepped off curb into a storm drain. Swelling to R ankle, PMS intact.

## 2022-12-18 NOTE — Discharge Instructions (Addendum)
Recommend ice, Tylenol, ibuprofen and rest.  Use walking boot for comfort.  Follow-up with orthopedics.  Weightbearing as tolerated with walking boot.

## 2023-11-23 ENCOUNTER — Encounter: Payer: Self-pay | Admitting: Sports Medicine
